# Patient Record
Sex: Female | Born: 1975 | Race: Black or African American | Hispanic: Yes | Smoking: Former smoker
Health system: Southern US, Community
[De-identification: ages and names within clinical notes are randomized; demographics above are authoritative.]

## PROBLEM LIST (undated history)

## (undated) DIAGNOSIS — T7840XA Allergy, unspecified, initial encounter: Secondary | ICD-10-CM

## (undated) DIAGNOSIS — N12 Tubulo-interstitial nephritis, not specified as acute or chronic: Secondary | ICD-10-CM

## (undated) DIAGNOSIS — M2550 Pain in unspecified joint: Secondary | ICD-10-CM

## (undated) DIAGNOSIS — F319 Bipolar disorder, unspecified: Secondary | ICD-10-CM

## (undated) DIAGNOSIS — I10 Essential (primary) hypertension: Secondary | ICD-10-CM

## (undated) DIAGNOSIS — K219 Gastro-esophageal reflux disease without esophagitis: Secondary | ICD-10-CM

## (undated) DIAGNOSIS — Z9289 Personal history of other medical treatment: Secondary | ICD-10-CM

## (undated) DIAGNOSIS — G43909 Migraine, unspecified, not intractable, without status migrainosus: Secondary | ICD-10-CM

## (undated) DIAGNOSIS — O1490 Unspecified pre-eclampsia, unspecified trimester: Secondary | ICD-10-CM

## (undated) DIAGNOSIS — Z973 Presence of spectacles and contact lenses: Secondary | ICD-10-CM

## (undated) DIAGNOSIS — Z8619 Personal history of other infectious and parasitic diseases: Secondary | ICD-10-CM

## (undated) DIAGNOSIS — E559 Vitamin D deficiency, unspecified: Secondary | ICD-10-CM

## (undated) DIAGNOSIS — F419 Anxiety disorder, unspecified: Secondary | ICD-10-CM

## (undated) DIAGNOSIS — E119 Type 2 diabetes mellitus without complications: Secondary | ICD-10-CM

## (undated) HISTORY — DX: Personal history of other infectious and parasitic diseases: Z86.19

## (undated) HISTORY — DX: Migraine, unspecified, not intractable, without status migrainosus: G43.909

## (undated) HISTORY — DX: Vitamin D deficiency, unspecified: E55.9

## (undated) HISTORY — DX: Personal history of other medical treatment: Z92.89

## (undated) HISTORY — DX: Gastro-esophageal reflux disease without esophagitis: K21.9

## (undated) HISTORY — DX: Allergy, unspecified, initial encounter: T78.40XA

## (undated) HISTORY — DX: Essential (primary) hypertension: I10

## (undated) HISTORY — DX: Bipolar disorder, unspecified: F31.9

## (undated) HISTORY — DX: Anxiety disorder, unspecified: F41.9

## (undated) HISTORY — DX: Presence of spectacles and contact lenses: Z97.3

## (undated) HISTORY — DX: Type 2 diabetes mellitus without complications: E11.9

## (undated) HISTORY — DX: Unspecified pre-eclampsia, unspecified trimester: O14.90

## (undated) HISTORY — PX: TUBAL LIGATION: SHX77

## (undated) HISTORY — DX: Pain in unspecified joint: M25.50

## (undated) HISTORY — DX: Tubulo-interstitial nephritis, not specified as acute or chronic: N12

---

## 2011-05-26 DIAGNOSIS — O1490 Unspecified pre-eclampsia, unspecified trimester: Secondary | ICD-10-CM

## 2011-05-26 DIAGNOSIS — N12 Tubulo-interstitial nephritis, not specified as acute or chronic: Secondary | ICD-10-CM

## 2011-05-26 HISTORY — DX: Unspecified pre-eclampsia, unspecified trimester: O14.90

## 2011-05-26 HISTORY — DX: Tubulo-interstitial nephritis, not specified as acute or chronic: N12

## 2012-05-25 HISTORY — PX: ORIF WRIST FRACTURE: SHX2133

## 2012-05-25 HISTORY — PX: CARPAL TUNNEL RELEASE: SHX101

## 2015-05-26 DIAGNOSIS — E119 Type 2 diabetes mellitus without complications: Secondary | ICD-10-CM

## 2015-05-26 HISTORY — DX: Type 2 diabetes mellitus without complications: E11.9

## 2016-05-25 HISTORY — PX: WRIST SURGERY: SHX841

## 2016-05-25 HISTORY — PX: DE QUERVAIN'S RELEASE: SHX1439

## 2016-05-25 HISTORY — PX: GASTRIC BYPASS: SHX52

## 2017-05-25 DIAGNOSIS — Z9289 Personal history of other medical treatment: Secondary | ICD-10-CM

## 2017-05-25 HISTORY — DX: Personal history of other medical treatment: Z92.89

## 2017-09-22 DIAGNOSIS — M779 Enthesopathy, unspecified: Secondary | ICD-10-CM | POA: Diagnosis not present

## 2017-09-22 DIAGNOSIS — M25531 Pain in right wrist: Secondary | ICD-10-CM | POA: Diagnosis not present

## 2017-10-13 DIAGNOSIS — M778 Other enthesopathies, not elsewhere classified: Secondary | ICD-10-CM | POA: Diagnosis not present

## 2017-10-13 DIAGNOSIS — M25531 Pain in right wrist: Secondary | ICD-10-CM | POA: Diagnosis not present

## 2018-06-15 ENCOUNTER — Telehealth: Payer: Self-pay | Admitting: Medical

## 2018-06-15 NOTE — Telephone Encounter (Signed)
Received records for new pt. First appt is 06/23/2018. Sending back for review.

## 2018-06-23 ENCOUNTER — Encounter: Payer: Self-pay | Admitting: Medical

## 2018-06-23 ENCOUNTER — Other Ambulatory Visit (HOSPITAL_COMMUNITY)
Admission: RE | Admit: 2018-06-23 | Discharge: 2018-06-23 | Disposition: A | Discharge status: Home / Self Care | Payer: BLUE CROSS/BLUE SHIELD | Source: Ambulatory Visit | Attending: Medical | Admitting: Medical

## 2018-06-23 ENCOUNTER — Ambulatory Visit (INDEPENDENT_AMBULATORY_CARE_PROVIDER_SITE_OTHER): Payer: BLUE CROSS/BLUE SHIELD | Admitting: Medical

## 2018-06-23 VITALS — BP 122/76 | HR 71 | Temp 98.6°F | Resp 16 | Ht 61.0 in | Wt 193.6 lb

## 2018-06-23 DIAGNOSIS — R4589 Other symptoms and signs involving emotional state: Secondary | ICD-10-CM

## 2018-06-23 DIAGNOSIS — E1169 Type 2 diabetes mellitus with other specified complication: Secondary | ICD-10-CM

## 2018-06-23 DIAGNOSIS — Z124 Encounter for screening for malignant neoplasm of cervix: Secondary | ICD-10-CM | POA: Diagnosis not present

## 2018-06-23 DIAGNOSIS — Z7189 Other specified counseling: Secondary | ICD-10-CM

## 2018-06-23 DIAGNOSIS — M25531 Pain in right wrist: Secondary | ICD-10-CM

## 2018-06-23 DIAGNOSIS — E119 Type 2 diabetes mellitus without complications: Secondary | ICD-10-CM | POA: Insufficient documentation

## 2018-06-23 DIAGNOSIS — F319 Bipolar disorder, unspecified: Secondary | ICD-10-CM

## 2018-06-23 DIAGNOSIS — G8929 Other chronic pain: Secondary | ICD-10-CM

## 2018-06-23 DIAGNOSIS — J309 Allergic rhinitis, unspecified: Secondary | ICD-10-CM | POA: Insufficient documentation

## 2018-06-23 DIAGNOSIS — Z0000 Encounter for general adult medical examination without abnormal findings: Secondary | ICD-10-CM | POA: Diagnosis not present

## 2018-06-23 DIAGNOSIS — I10 Essential (primary) hypertension: Secondary | ICD-10-CM

## 2018-06-23 DIAGNOSIS — F329 Major depressive disorder, single episode, unspecified: Secondary | ICD-10-CM

## 2018-06-23 DIAGNOSIS — K219 Gastro-esophageal reflux disease without esophagitis: Secondary | ICD-10-CM | POA: Insufficient documentation

## 2018-06-23 DIAGNOSIS — Z1231 Encounter for screening mammogram for malignant neoplasm of breast: Secondary | ICD-10-CM | POA: Insufficient documentation

## 2018-06-23 DIAGNOSIS — Z113 Encounter for screening for infections with a predominantly sexual mode of transmission: Secondary | ICD-10-CM

## 2018-06-23 DIAGNOSIS — F419 Anxiety disorder, unspecified: Secondary | ICD-10-CM

## 2018-06-23 DIAGNOSIS — Z7185 Encounter for immunization safety counseling: Secondary | ICD-10-CM | POA: Insufficient documentation

## 2018-06-23 DIAGNOSIS — E559 Vitamin D deficiency, unspecified: Secondary | ICD-10-CM

## 2018-06-23 DIAGNOSIS — G43909 Migraine, unspecified, not intractable, without status migrainosus: Secondary | ICD-10-CM | POA: Insufficient documentation

## 2018-06-23 HISTORY — DX: Pain in right wrist: M25.531

## 2018-06-23 HISTORY — DX: Allergic rhinitis, unspecified: J30.9

## 2018-06-23 HISTORY — DX: Encounter for screening for infections with a predominantly sexual mode of transmission: Z11.3

## 2018-06-23 HISTORY — DX: Gastro-esophageal reflux disease without esophagitis: K21.9

## 2018-06-23 HISTORY — DX: Other chronic pain: G89.29

## 2018-06-23 HISTORY — DX: Encounter for screening for malignant neoplasm of cervix: Z12.4

## 2018-06-23 HISTORY — DX: Migraine, unspecified, not intractable, without status migrainosus: G43.909

## 2018-06-23 HISTORY — DX: Other symptoms and signs involving emotional state: R45.89

## 2018-06-23 HISTORY — DX: Essential (primary) hypertension: I10

## 2018-06-23 LAB — POCT URINALYSIS DIP (PROADVANTAGE DEVICE)
Bilirubin, UA: NEGATIVE
Blood, UA: NEGATIVE
Glucose, UA: NEGATIVE mg/dL
Ketones, POC UA: NEGATIVE mg/dL
Leukocytes, UA: NEGATIVE
Nitrite, UA: NEGATIVE
Specific Gravity, Urine: 1.03
Urobilinogen, Ur: NEGATIVE
pH, UA: 6 (ref 5.0–8.0)

## 2018-06-23 MED ORDER — VITAMIN D 25 MCG (1000 UNIT) PO TABS: 1000.0000 [IU] | ORAL_TABLET | Freq: Every day | ORAL | 3 refills | Status: DC

## 2018-06-23 NOTE — Patient Instructions (Signed)
RESOURCES in [place], [place]  If you are experiencing a mental health crisis or an emergency, please call 911 or go to the nearest emergency department.  [facility]   [phone] [facility]  [phone] [facility]   [phone]  Suicide Hotline 1-800-Suicide 628 716 6963)  National Suicide Prevention Lifeline 5633020016  (757)320-2704)  Domestic Violence, Rape/Crisis - [facility] [place] 561-537-9432  [employer] Violence Hotline 1-800-799-SAFE (602)438-9412)  To report Child or Elder Abuse, please call: [facility] Police Department  [phone] [facility] Department  [phone]  [facility] Crisis Line (954)166-8050  Teen Crisis line 416-032-5361 or 941-079-3220     Psychiatry and Counseling services  [facility] [address], [place], [place] [address] [phone]  Stevphen Meuse, therapist Dr. Meredith Staggers, psychiatrist Dr. Beverly Milch, child psychiatrist   Dr. Len Blalock [address], [place], [place] [address] [phone]   Dr. Milagros Evener, psychiatry [address], [place], [place] [address] [phone]   [facility] [address] Sherian Maroon [place], [place] [address] [phone]   [facility] [address] [place], [place], [place] [address] [phone]    Counseling Services (NON- psychiatrist offices)  [facility] [address], [place], [place] [address] [phone]   [facility] [phone] [address] [place], [place] [address]   Glade Lloyd, therapist [phone] [address] [place], [place] [address]   [facility] [phone] [address] [place], [place], [place] [address]      Thanks for trusting [place] with your health care and for coming in for a physical today.  Below are some general recommendations I have for you:  Yearly screenings See your  eye doctor yearly for routine vision care. See your dentist yearly for routine dental care including hygiene visits twice yearly. See me here yearly for a routine physical and preventative care visit   Specific Concerns today:  Dr. Yancey Flemings, dentist [address], [place], [place] [address] [phone] [email or web address]   Please follow up yearly for a physical.    I have included other useful information below for your review.  Preventative Care for Adults - Female      MAINTAIN REGULAR HEALTH EXAMS:  A routine yearly physical is a good way to check in with your primary care provider about your health and preventive screening. It is also an opportunity to share updates about your health and any concerns you have, and receive a thorough all-over exam.   Most health insurance companies pay for at least some preventative services.  Check with your health plan for specific coverages.  WHAT PREVENTATIVE SERVICES DO WOMEN NEED?  Adult women should have their weight and blood pressure checked regularly.   Women age 41 and older should have their cholesterol levels checked regularly.  Women should be screened for cervical cancer with a Pap smear and pelvic exam beginning at either age 41, or 3 years after they become sexually activity.    Breast cancer screening generally begins at age 41 with a mammogram and breast exam by your primary care provider.    Beginning at age 41 and continuing to age 41, women should be screened for colorectal cancer.  Certain people may need continued testing until age 74.  Updating vaccinations is part of preventative care.  Vaccinations help protect against diseases such as the flu.  Osteoporosis is a disease in which the bones lose minerals and strength as  we age. Women ages 41 and over should discuss this with their caregivers, as should women after menopause who have other risk factors.  Lab tests are generally done as part of preventative care  to screen for anemia and blood disorders, to screen for problems with the kidneys and liver, to screen for bladder problems, to check blood sugar, and to check your cholesterol level.  Preventative services generally include counseling about diet, exercise, avoiding tobacco, drugs, excessive alcohol consumption, and sexually transmitted infections.    GENERAL RECOMMENDATIONS FOR GOOD HEALTH:  Healthy diet:  Eat a variety of foods, including fruit, vegetables, animal or vegetable protein, such as meat, fish, chicken, and eggs, or beans, lentils, tofu, and grains, such as rice.  Drink plenty of water daily.  Decrease saturated fat in the diet, avoid lots of red meat, processed foods, sweets, fast foods, and fried foods.  Exercise:  Aerobic exercise helps maintain good heart health. At least 30-40 minutes of moderate-intensity exercise is recommended. For example, a brisk walk that increases your heart rate and breathing. This should be done on most days of the week.   Find a type of exercise or a variety of exercises that you enjoy so that it becomes a part of your daily life.  Examples are running, walking, swimming, water aerobics, and biking.  For motivation and support, explore group exercise such as aerobic class, spin class, Zumba, Yoga,or  martial arts, etc.    Set exercise goals for yourself, such as a certain weight goal, walk or run in a race such as a 5k walk/run.  Speak to your primary care provider about exercise goals.  Disease prevention:  If you smoke or chew tobacco, find out from your caregiver how to quit. It can literally save your life, no matter how long you have been a tobacco user. If you do not use tobacco, never begin.   Maintain a healthy diet and normal weight. Increased weight leads to problems with blood pressure and diabetes.   The Body Mass Index or BMI is a way of measuring how much of your body is fat. Having a BMI above 27 increases the risk of heart  disease, diabetes, hypertension, stroke and other problems related to obesity. Your caregiver can help determine your BMI and based on it develop an exercise and dietary program to help you achieve or maintain this important measurement at a healthful level.  High blood pressure causes heart and blood vessel problems.  Persistent high blood pressure should be treated with medicine if weight loss and exercise do not work.   Fat and cholesterol leaves deposits in your arteries that can block them. This causes heart disease and vessel disease elsewhere in your body.  If your cholesterol is found to be high, or if you have heart disease or certain other medical conditions, then you may need to have your cholesterol monitored frequently and be treated with medication.   Ask if you should have a cardiac stress test if your history suggests this. A stress test is a test done on a treadmill that looks for heart disease. This test can find disease prior to there being a problem.  Menopause can be associated with physical symptoms and risks. Hormone replacement therapy is available to decrease these. You should talk to your caregiver about whether starting or continuing to take hormones is right for you.   Osteoporosis is a disease in which the bones lose minerals and strength as we age. This can result  in serious bone fractures. Risk of osteoporosis can be identified using a bone density scan. Women ages 7265 and over should discuss this with their caregivers, as should women after menopause who have other risk factors. Ask your caregiver whether you should be taking a calcium supplement and Vitamin D, to reduce the rate of osteoporosis.   Avoid drinking alcohol in excess (more than two drinks per day).  Avoid use of street drugs. Do not share needles with anyone. Ask for professional help if you need assistance or instructions on stopping the use of alcohol, cigarettes, and/or drugs.  Brush your teeth twice a  day with fluoride toothpaste, and floss once a day. Good oral hygiene prevents tooth decay and gum disease. The problems can be painful, unattractive, and can cause other health problems. Visit your dentist for a routine oral and dental check up and preventive care every 6-12 months.   Look at your skin regularly.  Use a mirror to look at your back. Notify your caregivers of changes in moles, especially if there are changes in shapes, colors, a size larger than a pencil eraser, an irregular border, or development of new moles.  Safety:  Use seatbelts 100% of the time, whether driving or as a passenger.  Use safety devices such as hearing protection if you work in environments with loud noise or significant background noise.  Use safety glasses when doing any work that could send debris in to the eyes.  Use a helmet if you ride a bike or motorcycle.  Use appropriate safety gear for contact sports.  Talk to your caregiver about gun safety.  Use sunscreen with a SPF (or skin protection factor) of 15 or greater.  Lighter skinned people are at a greater risk of skin cancer. Don't forget to also wear sunglasses in order to protect your eyes from too much damaging sunlight. Damaging sunlight can accelerate cataract formation.   Practice safe sex. Use condoms. Condoms are used for birth control and to help reduce the spread of sexually transmitted infections (or STIs).  Some of the STIs are gonorrhea (the clap), chlamydia, syphilis, trichomonas, herpes, HPV (human papilloma virus) and HIV (human immunodeficiency virus) which causes AIDS. The herpes, HIV and HPV are viral illnesses that have no cure. These can result in disability, cancer and death.   Keep carbon monoxide and smoke detectors in your home functioning at all times. Change the batteries every 6 months or use a model that plugs into the wall.   Vaccinations:  Stay up to date with your tetanus shots and other required immunizations. You should  have a booster for tetanus every 10 years. Be sure to get your flu shot every year, since 5%-20% of the [place] population comes down with the flu. The flu vaccine changes each year, so being vaccinated once is not enough. Get your shot in the fall, before the flu season peaks.   Other vaccines to consider:  Human Papilloma Virus or HPV causes cancer of the cervix, and other infections that can be transmitted from person to person. There is a vaccine for HPV, and females should get immunized between the ages of 2711 and 3726. It requires a series of 3 shots.   Pneumococcal vaccine to protect against certain types of pneumonia.  This is normally recommended for adults age 41 or older.  However, adults younger than 41 years old with certain underlying conditions such as diabetes, heart or lung disease should also receive the vaccine.  Shingles vaccine  to protect against Varicella Zoster if you are older than age 41, or younger than 41 years old with certain underlying illness.  If you have not had the Shingrix vaccine, please call your insurer to inquire about coverage for the Shingrix vaccine given in 2 doses.   Some insurers cover this vaccine after age 41, some cover this after age 41.  If your insurer covers this, then call to schedule appointment to have this vaccine here  Hepatitis A vaccine to protect against a form of infection of the liver by a virus acquired from food.  Hepatitis B vaccine to protect against a form of infection of the liver by a virus acquired from blood or body fluids, particularly if you work in health care.  If you plan to travel internationally, check with your local health department for specific vaccination recommendations.  Cancer Screening:  Breast cancer screening is essential to preventive care for women. All women age 41 and older should perform a breast self-exam every month. At age 41 and older, women should have their caregiver complete a breast exam each year. Women  at ages 5140 and older should have a mammogram (x-ray film) of the breasts. Your caregiver can discuss how often you need mammograms.    Cervical cancer screening includes taking a Pap smear (sample of cells examined under a microscope) from the cervix (end of the uterus). It also includes testing for HPV (Human Papilloma Virus, which can cause cervical cancer). Screening and a pelvic exam should begin at age 41, or 3 years after a woman becomes sexually active. Screening should occur every year, with a Pap smear but no HPV testing, up to age 41. After age 41, you should have a Pap smear every 3 years with HPV testing, if no HPV was found previously.   Most routine colon cancer screening begins at the age of 41. On a yearly basis, doctors may provide special easy to use take-home tests to check for hidden blood in the stool. Sigmoidoscopy or colonoscopy can detect the earliest forms of colon cancer and is life saving. These tests use a small camera at the end of a tube to directly examine the colon. Speak to your caregiver about this at age 150, when routine screening begins (and is repeated every 5 years unless early forms of pre-cancerous polyps or small growths are found).

## 2018-06-23 NOTE — Progress Notes (Signed)
Subjective:   HPI  Rebecca Mueller is a 43 y.o. female who presents for Chief Complaint  Patient presents with  . NP    NP CPE & Pap fasting labs  vision exam 05-2018    Medical care team includes: Tysinger, Cleda MccreedyDavid S, PA-C here for primary care Eye doctor  Concerns: Prior vaccines 2013 Td  Gynecological history: 6 pregnancies, 4 live births, 1 set of twins. Periods are regular, not heavy.  Periods were quite heavy on Xarelto this past year.  Divorced.  Divorced 2018.  Had one set of STD testing since the divorce, and has had new sexual partners this past year.    HTN - stopped her medication on her own after feeling dizzy on medication.   Last took these since 03/2018  Had diagnosis of Afib, early 2019, was on Xarelto for most 2019, but can't afford this.   [place] early in [place]   Was advised other testing, but never did this.  Reportedly she converted spontaneously back to normal rhythm.  Hx/o bipolar disorder, last on medication 03/2018, was on Latuda.  Stopped due to copay over $1000.  At one point was on Welbutrin.  Can't take welbutin alone due to this causing mania.   Didn't feel all that improved on Latuda + Wellbutrin.  In past hasn't responded well to medication.  Major triggers of mania in the past has been crisis moments or very stressful moments   This has only happened a few times.  Just moved here 3 months ago.   Past Medical History:  Diagnosis Date  . Allergy   . Anxiety   . Bipolar disorder [facility](HCC)    hospitalized 2010, 2013  . Diabetes mellitus without complication (HCC) 2017   type 2  . GERD (gastroesophageal reflux disease)   . History of cardiovascular stress test 2019   after chest pain, reportedly normal per patient 05/2018  . History of cold sores   . Hypertension   . Joint pain    right wrist, chronic pain  . Migraines    since childhood  . Preeclampsia 2013   with acute kidney failure, acute tubular necrosis, dialysis  1.5 mo, was in ICU  . Pyelonephritis 2013  . Vitamin D deficiency   . Wears glasses     Past Surgical History:  Procedure Laterality Date  . CARPAL TUNNEL RELEASE  2014  . CESAREAN SECTION     x4  . DE QUERVAIN'S RELEASE  2018  . 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 ORIF WRIST FRACTURE  2014   right  . TUBAL LIGATION    . WRIST SURGERY  2018   hardware removal     Social History   Socioeconomic History  . Marital status: Divorced    Spouse name: Not on file  . Number of children: Not on file  . Years of education: Not on file  . Highest education level: Not on file  Occupational History  . Not on file  Social Needs  . Financial resource strain: Not on file  . Food insecurity:    Worry: Not on file    Inability: Not on file  . Transportation needs:    Medical: Not on file    Non-medical: Not on file  Tobacco Use  . Smoking status: Former Smoker    Packs/day: 0.50    Years: 10.00    Pack years: 5.00    Last attempt to quit: 06/23/2016  Years since quitting: 2.0  . Smokeless tobacco: Never Used  Substance and Sexual Activity  . Alcohol use: Yes    Comment: occasional wine  . Drug use: Not Currently    Comment: prior marijuana  . Sexual activity: Not on file  Lifestyle  . Physical activity:    Days per week: Not on file    Minutes per session: Not on file  . Stress: Not on file  Relationships  . Social connections:    Talks on phone: Not on file    Gets together: Not on file    Attends religious service: Not on file    Active member of club or organization: Not on file    Attends meetings of clubs or organizations: Not on file    Relationship status: Not on file  . Intimate partner violence:    Fear of current or ex partner: Not on file    Emotionally abused: Not on file    Physically abused: Not on file    Forced sexual activity: Not on file  Other Topics Concern  . Not on file  Social History Narrative   Lives with her 4 children, [place],  moved from [place], [place] to [place] late 2019.  Works for [employer], Financial risk analyst.   05/2018    Family History  Problem Relation Age of Onset  . Stroke Mother   . Diabetes Mother   . Multiple sclerosis Mother   . Hypertension Mother   . Diabetes Father   . Hypertension Father   . Cancer Father        liver  . Diabetes Sister   . Hypertension Sister   . Bipolar disorder Sister   . Heart disease Maternal Grandmother   . Diabetes Maternal Grandmother   . Kidney disease Maternal Grandmother   . Hypertension Maternal Grandmother   . Heart disease Maternal Grandfather   . Hypertension Maternal Grandfather   . Cancer Maternal Grandfather        prostate  . Diabetes Sister   . Hypertension Sister   . Bipolar disorder Sister      Current Outpatient Medications:  .  Cholecalciferol (VITAMIN D3) 1.25 MG (50000 UT) CAPS, Take 1 capsule by mouth once a week., Disp: , Rfl:  .  omeprazole (PRILOSEC) 40 MG capsule, Take 40 mg by mouth daily., Disp: , Rfl:  .  cholecalciferol (VITAMIN D3) 25 MCG (1000 UT) tablet, Take 1 tablet (1,000 Units total) by mouth daily., Disp: 90 tablet, Rfl: 3 .  valACYclovir (VALTREX) 1000 MG tablet, Take 1,000 mg by mouth as needed., Disp: , Rfl:   Allergies  Allergen Reactions  . Nsaids     Avoids due to hx/o sleeve gastric bypass  . Penicillins Rash  . Promethazine Other (See Comments)    Shaky legs, restless legs     Reviewed their medical, surgical, family, social, medication, and allergy history and updated chart as appropriate.   Review of Systems Constitutional: -fever, -chills, -sweats, -unexpected weight change, -decreased appetite, -fatigue Allergy: -sneezing, -itching, -congestion Dermatology: -changing moles, --rash, -lumps ENT: -runny nose, -ear pain, -sore throat, -hoarseness, -sinus pain, -teeth pain, - ringing in ears, -hearing loss, -nosebleeds Cardiology: -chest pain, -palpitations, -swelling, -difficulty  breathing when lying flat, -waking up short of breath Respiratory: -cough, -shortness of breath, -difficulty breathing with exercise or exertion, -wheezing, -coughing up blood Gastroenterology: -abdominal pain, -nausea, -vomiting, -diarrhea, -constipation, -blood in stool, -changes in bowel movement, -difficulty swallowing or eating Hematology: -bleeding, -  bruising  Musculoskeletal: -joint aches, -muscle aches, -joint swelling, -back pain, -neck pain, -cramping, -changes in gait Ophthalmology: denies vision changes, eye redness, itching, discharge Urology: -burning with urination, -difficulty urinating, -blood in urine, -urinary frequency, -urgency, -incontinence Neurology: -headache, -weakness, -tingling, -numbness, -memory loss, -falls, -dizziness Psychology: +depressed mood, -agitation, -sleep problems Breast/gyn: -breast tenderness, -discharge, -lumps, -vaginal discharge,- irregular periods, -heavy periods     Objective:  BP 122/76   Pulse 71   Temp 98.6 F (37 C) (Oral)   Resp 16   Ht 5\' 1"  (1.549 m)   Wt 193 lb 9.6 oz (87.8 kg)   LMP 05/30/2018 (Approximate)   SpO2 99%   BMI 36.58 kg/m   General appearance: alert, no distress, WD/WN, African American female Skin: scattered macules, unremarkable HEENT: normocephalic, conjunctiva/corneas normal, sclerae anicteric, PERRLA, EOMi, nares patent, no discharge or erythema, pharynx normal Oral cavity: MMM, tongue normal, teeth with moderate plaque Neck: supple, no lymphadenopathy, no thyromegaly, no masses, normal ROM, no bruits Chest: non tender, normal shape and expansion Heart: RRR, normal S1, S2, no murmurs Lungs: CTA bilaterally, no wheezes, rhonchi, or rales Abdomen: +bs, soft, non tender, non distended, no masses, no hepatomegaly, no splenomegaly, no bruits Back: non tender, normal ROM, no scoliosis Musculoskeletal: right volar wrist linear surgical scar, upper extremities non tender, no obvious deformity, normal ROM  throughout, lower extremities non tender, no obvious deformity, normal ROM throughout Extremities: no edema, no cyanosis, no clubbing Pulses: 2+ symmetric, upper and lower extremities, normal cap refill Neurological: alert, oriented x 3, CN2-12 intact, strength normal upper extremities and lower extremities, sensation normal throughout, DTRs 2+ throughout, no cerebellar signs, gait normal Psychiatric: normal affect, behavior normal, pleasant  Breast: nontender, no masses or lumps, no skin changes, no nipple discharge or inversion, no axillary lymphadenopathy Gyn: Normal external genitalia without lesions, vagina with normal mucosa, cervix without lesions, no cervical motion tenderness, no abnormal vaginal discharge.  Uterus questionably enlarged, retroverted, otherwise adnexa not enlarged, nontender, no masses.  Pap performed.  Exam chaperoned by nurse. Rectal: deferred    Assessment and Plan :   Encounter Diagnoses  Name Primary?  . Routine general medical examination at a health care facility Yes  . Vaccine counseling   . Type 2 diabetes mellitus with other specified complication, without long-term current use of insulin (HCC)   . Vitamin D deficiency   . Chronic wrist pain, right   . Migraine without status migrainosus, not intractable, unspecified migraine type   . Gastroesophageal reflux disease without esophagitis   . Bipolar affective disorder, remission status unspecified (HCC)   . Depressed mood   . Anxiety   . Allergic rhinitis, unspecified seasonality, unspecified trigger   . Essential hypertension   . Screen for STD (sexually transmitted disease)   . Screening for cervical cancer     Physical exam - discussed and counseled on healthy lifestyle, diet, exercise, preventative care, vaccinations, sick and well care, proper use of emergency dept and after hours care, and addressed their concerns.    Health screening: Advised they see their eye doctor yearly for routine vision  care. Advised they see their dentist yearly for routine dental care including hygiene visits twice yearly.  Discussed STD testing, discussed prevention, condom use, means of transmission    S/p tubal ligation  Cancer screening Counseled on self breast exams, mammograms, cervical cancer screening  Vaccinations: Advised yearly influenza vaccine She declines Up to date on Td 2013   Separate significant issues discussed: Vaccine counseling: Recommend  yearly flu shot, consider vaccines for diabetics including pneumococcal vaccine, updated tetanus vaccine if not up-to-date  Diabetes type 2-she will start checking her blood sugars soon, she does currently glucometer, she is not currently on medication, no concern for foot lesions,  Vitamin D deficiency-labs today  Chronic wrist pain-history of fracture, ORIF surgery, subsequent carpal tunnel release, multiple surgeries on right wrist  Migraines- can discuss further with these are fairly frequently.  Consider separate follow-up for this  GERD- avoid GERD triggers  Bipolar disorder, depression, anxiety--establish with psychiatry and counseling.  Gave list of providers in the area  Hypertension- currently controlled with lifestyle    Dorothye was seen today for np.  Diagnoses and all orders for this visit:  Routine general medical examination at a health care facility -     POCT Urinalysis DIP (Proadvantage Device) -     Comprehensive metabolic panel -     CBC -     Lipid panel -     TSH -     Hemoglobin A1c -     HIV Antibody (routine testing w rflx) -     RPR -     Cytology - PAP([place]) -     Microalbumin / creatinine urine ratio -     VITAMIN D 25 Hydroxy (Vit-D Deficiency, Fractures)  Vaccine counseling  Type 2 diabetes mellitus with other specified complication, without long-term current use of insulin (HCC) -     Hemoglobin A1c -     Microalbumin / creatinine urine ratio  Vitamin D deficiency -     VITAMIN  D 25 Hydroxy (Vit-D Deficiency, Fractures)  Chronic wrist pain, right  Migraine without status migrainosus, not intractable, unspecified migraine type  Gastroesophageal reflux disease without esophagitis  Bipolar affective disorder, remission status unspecified (HCC)  Depressed mood  Anxiety  Allergic rhinitis, unspecified seasonality, unspecified trigger  Essential hypertension  Screen for STD (sexually transmitted disease) -     HIV Antibody (routine testing w rflx) -     RPR -     Cytology - PAP([place])  Screening for cervical cancer -     HIV Antibody (routine testing w rflx) -     RPR -     Cytology - PAP([place])  Other orders -     cholecalciferol (VITAMIN D3) 25 MCG (1000 UT) tablet; Take 1 tablet (1,000 Units total) by mouth daily.    Follow-up pending labs, yearly for physical

## 2018-06-24 LAB — COMPREHENSIVE METABOLIC PANEL
ALT: 7 IU/L (ref 0–32)
AST: 10 IU/L (ref 0–40)
Albumin/Globulin Ratio: 1.7 (ref 1.2–2.2)
Albumin: 4.5 g/dL (ref 3.8–4.8)
Alkaline Phosphatase: 70 IU/L (ref 39–117)
BUN/Creatinine Ratio: 9 (ref 9–23)
BUN: 10 mg/dL (ref 6–24)
Bilirubin Total: 0.4 mg/dL (ref 0.0–1.2)
CO2: 19 mmol/L — ABNORMAL LOW (ref 20–29)
Calcium: 9.3 mg/dL (ref 8.7–10.2)
Chloride: 104 mmol/L (ref 96–106)
Creatinine, Ser: 1.13 mg/dL — ABNORMAL HIGH (ref 0.57–1.00)
GFR calc Af Amer: 70 mL/min/{1.73_m2} (ref >59)
GFR calc non Af Amer: 61 mL/min/{1.73_m2} (ref >59)
Globulin, Total: 2.6 g/dL (ref 1.5–4.5)
Glucose: 87 mg/dL (ref 65–99)
Potassium: 4.7 mmol/L (ref 3.5–5.2)
Sodium: 141 mmol/L (ref 134–144)
Total Protein: 7.1 g/dL (ref 6.0–8.5)

## 2018-06-24 LAB — LIPID PANEL
Chol/HDL Ratio: 2.3 ratio (ref 0.0–4.4)
Cholesterol, Total: 223 mg/dL — ABNORMAL HIGH (ref 100–199)
HDL: 95 mg/dL (ref >39)
LDL Calculated: 110 mg/dL — ABNORMAL HIGH (ref 0–99)
Triglycerides: 89 mg/dL (ref 0–149)
VLDL Cholesterol Cal: 18 mg/dL (ref 5–40)

## 2018-06-24 LAB — CBC
Hematocrit: 36.3 % (ref 34.0–46.6)
Hemoglobin: 12.4 g/dL (ref 11.1–15.9)
MCH: 28.4 pg (ref 26.6–33.0)
MCHC: 34.2 g/dL (ref 31.5–35.7)
MCV: 83 fL (ref 79–97)
Platelets: 268 10*3/uL (ref 150–450)
RBC: 4.37 x10E6/uL (ref 3.77–5.28)
RDW: 12.2 % (ref 11.7–15.4)
WBC: 4.9 10*3/uL (ref 3.4–10.8)

## 2018-06-24 LAB — TSH: TSH: 1.41 u[IU]/mL (ref 0.450–4.500)

## 2018-06-24 LAB — HIV ANTIBODY (ROUTINE TESTING W REFLEX): HIV Screen 4th Generation wRfx: NONREACTIVE

## 2018-06-24 LAB — MICROALBUMIN / CREATININE URINE RATIO
Creatinine, Urine: 347.4 mg/dL
Microalb/Creat Ratio: 5 mg/g creat (ref 0–29)
Microalbumin, Urine: 17.7 ug/mL

## 2018-06-24 LAB — RPR: RPR Ser Ql: NONREACTIVE

## 2018-06-24 LAB — HEMOGLOBIN A1C
Est. average glucose Bld gHb Est-mCnc: 120 mg/dL
Hgb A1c MFr Bld: 5.8 % — ABNORMAL HIGH (ref 4.8–5.6)

## 2018-06-24 LAB — VITAMIN D 25 HYDROXY (VIT D DEFICIENCY, FRACTURES): Vit D, 25-Hydroxy: 48.2 ng/mL (ref 30.0–100.0)

## 2018-06-27 ENCOUNTER — Encounter: Payer: Self-pay | Admitting: Medical

## 2018-06-28 LAB — CYTOLOGY - PAP
Adequacy: ABSENT
Chlamydia: NEGATIVE
Diagnosis: NEGATIVE
Neisseria Gonorrhea: NEGATIVE

## 2018-07-04 ENCOUNTER — Telehealth: Payer: Self-pay | Admitting: Medical

## 2018-07-04 ENCOUNTER — Encounter: Payer: Self-pay | Admitting: Medical

## 2018-07-04 NOTE — Telephone Encounter (Signed)
Received requested records from [facility]. Sending back for review.

## 2018-07-15 ENCOUNTER — Encounter: Payer: Self-pay | Admitting: Medical

## 2018-10-21 ENCOUNTER — Other Ambulatory Visit: Payer: Self-pay | Admitting: Medical

## 2018-10-21 DIAGNOSIS — E559 Vitamin D deficiency, unspecified: Secondary | ICD-10-CM

## 2019-03-13 ENCOUNTER — Other Ambulatory Visit: Payer: Self-pay

## 2019-03-13 DIAGNOSIS — Z20822 Contact with and (suspected) exposure to covid-19: Secondary | ICD-10-CM

## 2019-03-13 DIAGNOSIS — Z20828 Contact with and (suspected) exposure to other viral communicable diseases: Secondary | ICD-10-CM | POA: Diagnosis not present

## 2019-03-15 LAB — NOVEL CORONAVIRUS, NAA: SARS-CoV-2, NAA: NOT DETECTED

## 2019-04-16 DIAGNOSIS — M25531 Pain in right wrist: Secondary | ICD-10-CM | POA: Diagnosis not present

## 2019-04-16 DIAGNOSIS — N898 Other specified noninflammatory disorders of vagina: Secondary | ICD-10-CM | POA: Diagnosis not present

## 2019-04-21 ENCOUNTER — Other Ambulatory Visit: Payer: Self-pay | Admitting: Medical

## 2019-04-21 DIAGNOSIS — E559 Vitamin D deficiency, unspecified: Secondary | ICD-10-CM

## 2019-04-24 NOTE — Telephone Encounter (Signed)
Does pt need vitamin D 50,000 rx or otc vitamin D

## 2019-04-24 NOTE — Telephone Encounter (Signed)
Refill x 1 and get in for med check.

## 2019-07-25 ENCOUNTER — Other Ambulatory Visit: Payer: Self-pay

## 2019-07-25 ENCOUNTER — Telehealth: Payer: Self-pay | Admitting: Medical

## 2019-07-25 DIAGNOSIS — E559 Vitamin D deficiency, unspecified: Secondary | ICD-10-CM

## 2019-07-25 NOTE — Telephone Encounter (Signed)
The request has been sent to provider.

## 2019-07-25 NOTE — Telephone Encounter (Signed)
Pt called and made an CPE appt when she is off work in April. Please refill Vitamin DS 1.25 until that appt. Pt uses [facility].

## 2019-07-26 ENCOUNTER — Other Ambulatory Visit: Payer: Self-pay | Admitting: Medical

## 2019-07-26 DIAGNOSIS — E559 Vitamin D deficiency, unspecified: Secondary | ICD-10-CM

## 2019-07-26 NOTE — Progress Notes (Signed)
Refill vit D 30 days, get in for physical.     This message comes to me looking different than most refill requests or other messages.  Can you explain why this message looks different?

## 2019-07-26 NOTE — Telephone Encounter (Signed)
Is this appropriate to refill for this dosage and this amount?

## 2019-07-26 NOTE — Telephone Encounter (Signed)
Please review previous message. 

## 2019-07-27 NOTE — Progress Notes (Signed)
This has been taken care of.

## 2019-08-10 ENCOUNTER — Emergency Department: Payer: BC Managed Care – PPO

## 2019-08-10 ENCOUNTER — Other Ambulatory Visit: Payer: Self-pay

## 2019-08-10 DIAGNOSIS — Z87891 Personal history of nicotine dependence: Secondary | ICD-10-CM | POA: Diagnosis not present

## 2019-08-10 DIAGNOSIS — R0602 Shortness of breath: Secondary | ICD-10-CM | POA: Diagnosis not present

## 2019-08-10 DIAGNOSIS — Z79899 Other long term (current) drug therapy: Secondary | ICD-10-CM | POA: Diagnosis not present

## 2019-08-10 DIAGNOSIS — E119 Type 2 diabetes mellitus without complications: Secondary | ICD-10-CM | POA: Insufficient documentation

## 2019-08-10 DIAGNOSIS — I10 Essential (primary) hypertension: Secondary | ICD-10-CM | POA: Diagnosis not present

## 2019-08-10 DIAGNOSIS — I82401 Acute embolism and thrombosis of unspecified deep veins of right lower extremity: Secondary | ICD-10-CM | POA: Diagnosis not present

## 2019-08-10 DIAGNOSIS — I82451 Acute embolism and thrombosis of right peroneal vein: Secondary | ICD-10-CM | POA: Diagnosis not present

## 2019-08-10 DIAGNOSIS — M79661 Pain in right lower leg: Secondary | ICD-10-CM | POA: Diagnosis not present

## 2019-08-10 LAB — COMPREHENSIVE METABOLIC PANEL WITH GFR
ALT: 89 U/L — ABNORMAL HIGH (ref 0–44)
AST: 49 U/L — ABNORMAL HIGH (ref 15–41)
Albumin: 4.4 g/dL (ref 3.5–5.0)
Alkaline Phosphatase: 59 U/L (ref 38–126)
Anion gap: 10 (ref 5–15)
BUN: 13 mg/dL (ref 6–20)
CO2: 23 mmol/L (ref 22–32)
Calcium: 9.1 mg/dL (ref 8.9–10.3)
Chloride: 103 mmol/L (ref 98–111)
Creatinine, Ser: 1.01 mg/dL — ABNORMAL HIGH (ref 0.44–1.00)
GFR calc Af Amer: >60 mL/min
GFR calc non Af Amer: >60 mL/min
Glucose, Bld: 99 mg/dL (ref 70–99)
Potassium: 3.9 mmol/L (ref 3.5–5.1)
Sodium: 136 mmol/L (ref 135–145)
Total Bilirubin: 0.7 mg/dL (ref 0.3–1.2)
Total Protein: 7.7 g/dL (ref 6.5–8.1)

## 2019-08-10 LAB — CBC WITH DIFFERENTIAL/PLATELET
Abs Immature Granulocytes: 0.01 K/uL (ref 0.00–0.07)
Basophils Absolute: 0.1 K/uL (ref 0.0–0.1)
Basophils Relative: 1 %
Eosinophils Absolute: 0.1 K/uL (ref 0.0–0.5)
Eosinophils Relative: 1 %
HCT: 35.5 % — ABNORMAL LOW (ref 36.0–46.0)
Hemoglobin: 11.4 g/dL — ABNORMAL LOW (ref 12.0–15.0)
Immature Granulocytes: 0 %
Lymphocytes Relative: 51 %
Lymphs Abs: 3.3 K/uL (ref 0.7–4.0)
MCH: 26.1 pg (ref 26.0–34.0)
MCHC: 32.1 g/dL (ref 30.0–36.0)
MCV: 81.2 fL (ref 80.0–100.0)
Monocytes Absolute: 0.4 K/uL (ref 0.1–1.0)
Monocytes Relative: 6 %
Neutro Abs: 2.6 K/uL (ref 1.7–7.7)
Neutrophils Relative %: 41 %
Platelets: 312 K/uL (ref 150–400)
RBC: 4.37 MIL/uL (ref 3.87–5.11)
RDW: 14.8 % (ref 11.5–15.5)
WBC: 6.5 K/uL (ref 4.0–10.5)
nRBC: 0 % (ref 0.0–0.2)

## 2019-08-10 LAB — PROTIME-INR
INR: 1 (ref 0.8–1.2)
Prothrombin Time: 13.2 seconds (ref 11.4–15.2)

## 2019-08-10 NOTE — ED Notes (Signed)
Right pedal pulse palpable.

## 2019-08-10 NOTE — ED Triage Notes (Signed)
Pt to the er for right calf pain for 3 weeks. Pt states pain became worse today and pressure down to the foot. The pain is posterior. Pt has a hx of afib and was on xarelto but stopped 1 year ago due to cost of drug. Pt has a sedentary job.

## 2019-08-11 ENCOUNTER — Encounter: Payer: Self-pay | Admitting: Medical

## 2019-08-11 ENCOUNTER — Emergency Department: Payer: BC Managed Care – PPO

## 2019-08-11 ENCOUNTER — Ambulatory Visit (INDEPENDENT_AMBULATORY_CARE_PROVIDER_SITE_OTHER): Payer: BC Managed Care – PPO | Admitting: Medical

## 2019-08-11 ENCOUNTER — Emergency Department
Admission: EM | Admit: 2019-08-11 | Discharge: 2019-08-11 | Disposition: A | Discharge status: Home / Self Care | Payer: BC Managed Care – PPO | Attending: Emergency Medicine | Admitting: Emergency Medicine

## 2019-08-11 ENCOUNTER — Telehealth: Payer: Self-pay | Admitting: Medical

## 2019-08-11 VITALS — Ht 60.0 in | Wt 190.0 lb

## 2019-08-11 DIAGNOSIS — Z8679 Personal history of other diseases of the circulatory system: Secondary | ICD-10-CM

## 2019-08-11 DIAGNOSIS — I824Y1 Acute embolism and thrombosis of unspecified deep veins of right proximal lower extremity: Secondary | ICD-10-CM | POA: Diagnosis not present

## 2019-08-11 DIAGNOSIS — I82451 Acute embolism and thrombosis of right peroneal vein: Secondary | ICD-10-CM

## 2019-08-11 DIAGNOSIS — Z832 Family history of diseases of the blood and blood-forming organs and certain disorders involving the immune mechanism: Secondary | ICD-10-CM

## 2019-08-11 DIAGNOSIS — R52 Pain, unspecified: Secondary | ICD-10-CM

## 2019-08-11 DIAGNOSIS — M79604 Pain in right leg: Secondary | ICD-10-CM | POA: Insufficient documentation

## 2019-08-11 DIAGNOSIS — Z8759 Personal history of other complications of pregnancy, childbirth and the puerperium: Secondary | ICD-10-CM

## 2019-08-11 DIAGNOSIS — R918 Other nonspecific abnormal finding of lung field: Secondary | ICD-10-CM | POA: Diagnosis not present

## 2019-08-11 HISTORY — DX: Personal history of other diseases of the circulatory system: Z86.79

## 2019-08-11 HISTORY — DX: Acute embolism and thrombosis of right peroneal vein: I82.451

## 2019-08-11 HISTORY — DX: Family history of diseases of the blood and blood-forming organs and certain disorders involving the immune mechanism: Z83.2

## 2019-08-11 HISTORY — DX: Personal history of other complications of pregnancy, childbirth and the puerperium: Z87.59

## 2019-08-11 HISTORY — DX: Pain in right leg: M79.604

## 2019-08-11 LAB — POCT PREGNANCY, URINE: Preg Test, Ur: NEGATIVE

## 2019-08-11 LAB — BRAIN NATRIURETIC PEPTIDE: B Natriuretic Peptide: 18 pg/mL (ref 0.0–100.0)

## 2019-08-11 LAB — TROPONIN I (HIGH SENSITIVITY): Troponin I (High Sensitivity): <2 ng/L (ref <18)

## 2019-08-11 MED ORDER — IOHEXOL 350 MG/ML SOLN
75.0000 mL | Freq: Once | INTRAVENOUS | Status: AC | PRN
  Administered 2019-08-11: 75 mL via INTRAVENOUS
  Filled 2019-08-11: qty 75

## 2019-08-11 MED ORDER — HYDROCODONE-ACETAMINOPHEN 7.5-325 MG PO TABS: 1.0000 | ORAL_TABLET | Freq: Four times a day (QID) | ORAL | 0 refills | Status: AC | PRN

## 2019-08-11 MED ORDER — RIVAROXABAN 15 MG PO TABS
15.0000 mg | ORAL_TABLET | Freq: Once | ORAL | Status: AC
  Administered 2019-08-11: 15 mg via ORAL
  Filled 2019-08-11: qty 1

## 2019-08-11 MED ORDER — RIVAROXABAN (XARELTO) VTE STARTER PACK (15 & 20 MG): ORAL_TABLET | ORAL | 0 refills | Status: DC

## 2019-08-11 MED ORDER — DIPHENHYDRAMINE HCL 50 MG/ML IJ SOLN
50.0000 mg | Freq: Once | INTRAMUSCULAR | Status: AC
  Administered 2019-08-11: 50 mg via INTRAVENOUS
  Filled 2019-08-11: qty 1

## 2019-08-11 NOTE — ED Notes (Signed)
Pt transported to CT ?

## 2019-08-11 NOTE — Progress Notes (Signed)
This visit type was conducted due to national recommendations for restrictions regarding the COVID-19 Pandemic (e.g. social distancing) in an effort to limit this patient's exposure and mitigate transmission in our community.  Due to their co-morbid illnesses, this patient is at least at moderate risk for complications without adequate follow up.  This format is felt to be most appropriate for this patient at this time.    Documentation for virtual audio and video telecommunications through Zoom encounter:  The patient was located at home. The provider was located in the office. The patient did consent to this visit and is aware of possible charges through their insurance for this visit.  The other persons participating in this telemedicine service were none. Time spent on call was 20 minutes and in review of previous records >20 minutes total.  This virtual service is not related to other E/M service within previous 7 days.  Subjective: Chief Complaint  Patient presents with  . Follow-up    clot if the leg   Virtual consult today for DVT.  She was seen in emergency department yesterday for 2 to 3 weeks of right calf pain and was diagnosed with DVT.  She started Xarelto yesterday.  This was unprovoked.  No recent injury, not on birth control.  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  She does have family history of blood clot and PE on mother's side, she notes 2 miscarriages herself.  No personal history of DVT or PE.  She denies shortness of breath.  Her leg is still painful which is why she called in asking for something for pain more than just Tylenol.  She denies wheezing or shortness of breath no chest pain.  She does have a history of atrial flutter in the past  She denies recent surgery, trauma, injury, fall, no recent long travel, recent history of procedure otherwise.  She has a history of tubal ligation.  She vapes.  No other aggravating or relieving factors. No other  complaint.     Past Medical History:  Diagnosis Date  . Allergy   . Anxiety   . Bipolar disorder [facility])    hospitalized 2010, 2013  . Diabetes mellitus without complication ([place]) 0865   type 2  . GERD (gastroesophageal reflux disease)   . History of cardiovascular stress test 2019   after chest pain, reportedly normal per patient 05/2018  . History of cold sores   . Hypertension   . Joint pain    right wrist, chronic pain  . Migraines    since childhood  . Preeclampsia 2013   with acute kidney failure, acute tubular necrosis, dialysis 1.5 mo, was in ICU  . Pyelonephritis 2013  . Vitamin D deficiency   . Wears glasses    Current Outpatient Medications on File Prior to Visit  Medication Sig Dispense Refill  . buPROPion (WELLBUTRIN XL) 150 MG 24 hr tablet Take 150 mg by mouth daily.    . Cholecalciferol (VITAMIN D3) 1.25 MG (50000 UT) CAPS TAKE 1 CAPSULE BY MOUTH ONE TIME PER WEEK 4 capsule 0  . omeprazole (PRILOSEC) 40 MG capsule Take 40 mg by mouth daily.    . Rivaroxaban 15 & 20 MG TBPK Follow package directions: Take one 15mg  tablet by mouth twice a day. On day 22, switch to one 20mg  tablet once a day. Take with food. 51 each 0  . cholecalciferol (VITAMIN D3) 25 MCG (1000 UT) tablet Take 1 tablet (1,000 Units total) by mouth  daily. (Patient not taking: Reported on 08/11/2019) 90 tablet 3  . valACYclovir (VALTREX) 1000 MG tablet Take 1,000 mg by mouth as needed.     No current facility-administered medications on file prior to visit.   ROS as in subjective     Objective: Ht 5' (1.524 m)   Wt 190 lb (86.2 kg)   BMI 37.11 kg/m   Not examined in person as this was a virtual consult   Assessment: Encounter Diagnoses  Name Primary?  . Acute deep vein thrombosis (DVT) of proximal vein of right lower extremity (HCC) Yes  . Leg pain, diffuse, right   . Family history of clotting disorder   . History of miscarriage   . History of atrial flutter      Plan: I  reviewed her emergency department notes from yesterday's visit including positive DVT on ultrasound.  We discussed potential risk factor.  Discussed treatment.  Discussed possible complications.  She just started Xarelto yesterday.  Discussed risk and benefits and proper use of medication.  We discussed complete rest in the next 3 to 4 days through the weekend then gradual return to walking limited the first week or 2.  Advise she begin compression hose over-the-counter after the first 4 to 5 days.  Avoid NSAIDs.  We discussed potential risk factors.  I suspect underlying clotting disorder given family history and unprovoked DVT.  After 4 to 6 months of therapy, we will need to do a hypercoagulable lab panel  She will return soon for physical  I asked her to call back in 4 to 5 days to give me an update on symptoms  Pain medicine as below.  Discussed risk,  Benefits,  proper use of pain medicine.  Aubria was seen today for follow-up.  Diagnoses and all orders for this visit:  Acute deep vein thrombosis (DVT) of proximal vein of right lower extremity (HCC)  Leg pain, diffuse, right  Family history of clotting disorder  History of miscarriage  History of atrial flutter  Other orders -     HYDROcodone-acetaminophen (NORCO) 7.5-325 MG tablet; Take 1 tablet by mouth every 6 (six) hours as needed for up to 5 days for moderate pain.    F/u 4-5 days

## 2019-08-11 NOTE — Telephone Encounter (Signed)
She sent my chart message.  Please set up for in person or virtual consult today if possible ASAP due to her request for pain medicaiton and diagnosis of DVT blood clot

## 2019-08-11 NOTE — ED Provider Notes (Signed)
[facility] Emergency Department Provider Note  ____________________________________________  Time seen: Approximately 2:22 AM  I have reviewed the triage vital signs and the nursing notes.   HISTORY  Chief Complaint calf pain   HPI Lashawnda Marquard is a 44 y.o. female presents for evaluation of right lower extremity pain.  Patient reports that she has had pain intermittently for about 3 weeks.  Sharp located in the right calf.  Over the last 24 hours she started feeling like her leg was very heavy and the pain became more severe which prompted her to do a telemedicine consult.  She was recommended to come to the emergency room.  Patient denies any personal or family history of PE or DVT, recent travel immobilization, hemoptysis or exogenous hormones.  She does report that she sits for several hours sometimes up to 16 hours a day behind a desk at work.  She was previously on Xarelto for an episode of atrial flutter however she discontinued it about a 1 year ago because she was unable to afforded.  She denies chest pain but has noted over the last 2 to 3 days that she feels a little bit more winded when walking.  No shortness of breath at rest.   Past Medical History:  Diagnosis Date  . Allergy   . Anxiety   . Bipolar disorder [facility])    hospitalized 2010, 2013  . Diabetes mellitus without complication (HCC) 2017   type 2  . GERD (gastroesophageal reflux disease)   . History of cardiovascular stress test 2019   after chest pain, reportedly normal per patient 05/2018  . History of cold sores   . Hypertension   . Joint pain    right wrist, chronic pain  . Migraines    since childhood  . Preeclampsia 2013   with acute kidney failure, acute tubular necrosis, dialysis 1.5 mo, was in ICU  . Pyelonephritis 2013  . Vitamin D deficiency   . Wears glasses     Patient Active Problem List   Diagnosis Date Noted  . Essential hypertension 06/23/2018  . Allergic  rhinitis 06/23/2018  . Anxiety 06/23/2018  . Depressed mood 06/23/2018  . Bipolar disorder (HCC) 06/23/2018  . Gastroesophageal reflux disease without esophagitis 06/23/2018  . Migraine without status migrainosus, not intractable 06/23/2018  . Chronic wrist pain, right 06/23/2018  . Vitamin D deficiency 06/23/2018  . Diabetes mellitus (HCC) 06/23/2018  . Vaccine counseling 06/23/2018  . Screen for STD (sexually transmitted disease) 06/23/2018  . Screening for cervical cancer 06/23/2018    Past Surgical History:  Procedure Laterality Date  . CARPAL TUNNEL RELEASE  2014  . CESAREAN SECTION     x4  . DE QUERVAIN'S RELEASE  2018  . 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 ORIF WRIST FRACTURE  2014   right  . TUBAL LIGATION    . WRIST SURGERY  2018   hardware removal     Prior to Admission medications   Medication Sig Start Date End Date Taking? Authorizing Provider  Cholecalciferol (VITAMIN D3) 1.25 MG (50000 UT) CAPS TAKE 1 CAPSULE BY MOUTH ONE TIME PER WEEK 07/26/19   Tysinger, Kermit Balo, PA-C  cholecalciferol (VITAMIN D3) 25 MCG (1000 UT) tablet Take 1 tablet (1,000 Units total) by mouth daily. 06/23/18   Tysinger, Kermit Balo, PA-C  omeprazole (PRILOSEC) 40 MG capsule Take 40 mg by mouth daily.    [provider]  Rivaroxaban 15 & 20 MG TBPK Follow  package directions: Take one 15mg  tablet by mouth twice a day. On day 22, switch to one 20mg  tablet once a day. Take with food. 08/11/19   Nita SickleVeronese, Dawson, MD  valACYclovir (VALTREX) 1000 MG tablet Take 1,000 mg by mouth as needed.    [provider]    Allergies Nsaids, Penicillins, and Promethazine  Family History  Problem Relation Age of Onset  . Stroke Mother   . Diabetes Mother   . Multiple sclerosis Mother   . Hypertension Mother   . Diabetes Father   . Hypertension Father   . Cancer Father        liver  . Diabetes Sister   . Hypertension Sister   . Bipolar disorder Sister   . Heart disease Maternal  Grandmother   . Diabetes Maternal Grandmother   . Kidney disease Maternal Grandmother   . Hypertension Maternal Grandmother   . Heart disease Maternal Grandfather   . Hypertension Maternal Grandfather   . Cancer Maternal Grandfather        prostate  . Diabetes Sister   . Hypertension Sister   . Bipolar disorder Sister     Social History Social History   Tobacco Use  . Smoking status: Former Smoker    Packs/day: 0.50    Years: 10.00    Pack years: 5.00    Quit date: 06/23/2016    Years since quitting: 3.1  . Smokeless tobacco: Never Used  Substance Use Topics  . Alcohol use: Yes    Comment: occasional wine  . Drug use: Not Currently    Comment: prior marijuana    Review of Systems Constitutional: Negative for fever. Eyes: Negative for visual changes. ENT: Negative for sore throat. Neck: No neck pain  Cardiovascular: Negative for chest pain. Respiratory: Negative for shortness of breath. + DOE Gastrointestinal: Negative for abdominal pain, vomiting or diarrhea. Genitourinary: Negative for dysuria. Musculoskeletal: Negative for back pain. + RLE pain Skin: Negative for rash. Neurological: Negative for headaches, weakness or numbness. Psych: No SI or HI  ____________________________________________   PHYSICAL EXAM:  VITAL SIGNS: ED Triage Vitals  Enc Vitals Group     BP 08/10/19 2031 138/90     Pulse Rate 08/10/19 2031 89     Resp --      Temp 08/10/19 2031 98.3 F (36.8 C)     Temp Source 08/10/19 2031 Oral     SpO2 08/10/19 2031 100 %     Weight 08/10/19 2032 190 lb (86.2 kg)     Height 08/10/19 2032 5' (1.524 m)     Head Circumference --      Peak Flow --      Pain Score 08/10/19 2037 6     Pain Loc --      Pain Edu? --      Excl. in GC? --     Constitutional: Alert and oriented. Well appearing and in no apparent distress. HEENT:      Head: Normocephalic and atraumatic.         Eyes: Conjunctivae are normal. Sclera is non-icteric.        Mouth/Throat: Mucous membranes are moist.       Neck: Supple with no signs of meningismus. Cardiovascular: Regular rate and rhythm. No murmurs, gallops, or rubs.  Respiratory: Normal respiratory effort. Lungs are clear to auscultation bilaterally. No wheezes, crackles, or rhonchi.  Gastrointestinal: Soft, non tender, and non distended with positive bowel sounds. No rebound or guarding. Musculoskeletal: No pitting edema, cyanosis, erythema  of her extremities.  The right calf looks slightly bigger than the left.   Neurologic: Normal speech and language. Face is symmetric. Moving all extremities. No gross focal neurologic deficits are appreciated. Skin: Skin is warm, dry and intact. No rash noted. Psychiatric: Mood and affect are normal. Speech and behavior are normal.  ____________________________________________   LABS (all labs ordered are listed, but only abnormal results are displayed)  Labs Reviewed  COMPREHENSIVE METABOLIC PANEL - Abnormal; Notable for the following components:      Result Value   Creatinine, Ser 1.01 (*)    AST 49 (*)    ALT 89 (*)    All other components within normal limits  CBC WITH DIFFERENTIAL/PLATELET - Abnormal; Notable for the following components:   Hemoglobin 11.4 (*)    HCT 35.5 (*)    All other components within normal limits  PROTIME-INR  BRAIN NATRIURETIC PEPTIDE  POC URINE PREG, ED  POCT PREGNANCY, URINE  TROPONIN I (HIGH SENSITIVITY)   ____________________________________________  EKG  ED ECG REPORT I, Nita Sickle, the attending physician, personally viewed and interpreted this ECG.  Normal sinus rhythm, rate of 66, normal intervals, normal axis, no ST elevations or depressions.  Normal EKG.  ____________________________________________  RADIOLOGY  I have personally reviewed the images performed during this visit and I agree with the Radiologist's read.   Interpretation by Radiologist:  CT Angio Chest PE W and/or Wo  Contrast  Result Date: 08/11/2019 CLINICAL DATA:  Calf pain for 3 weeks, worsening today EXAM: CT ANGIOGRAPHY CHEST WITH CONTRAST TECHNIQUE: Multidetector CT imaging of the chest was performed using the standard protocol during bolus administration of intravenous contrast. Multiplanar CT image reconstructions and MIPs were obtained to evaluate the vascular anatomy. CONTRAST:  72mL OMNIPAQUE IOHEXOL 350 MG/ML SOLN COMPARISON:  None. FINDINGS: Cardiovascular: Borderline opacification of the pulmonary arteries (HU 200) may limit detection of smaller segmental and subsegmental pulmonary arterial filling defects. No large central or lobar pulmonary artery emboli are seen. Central pulmonary arteries are normal caliber. Normal heart size. No pericardial effusion. The aortic root is suboptimally assessed given cardiac pulsation artifact. The aorta is normal caliber. No intramural hematoma, dissection flap or other acute luminal abnormality of the aorta is seen. No periaortic stranding or hemorrhage. Shared origin of the brachiocephalic and left common carotid artery. Proximal great vessels are otherwise unremarkable. Mediastinum/Nodes: No mediastinal fluid or gas. 1.5 cm hypoattenuating thyroid nodule in the left lobe thyroid gland. Right gland and thoracic inlet are unremarkable. No acute abnormality of the trachea. Small hiatal hernia including portion of the proximal suture line of the patient's sleeve gastrectomy. No worrisome mediastinal, hilar or axillary adenopathy. Lungs/Pleura: No consolidation, features of edema, pneumothorax, or effusion. No suspicious pulmonary nodules or masses. Upper Abdomen: No acute abnormalities present in the visualized portions of the upper abdomen. Musculoskeletal: Postsurgical changes from prior sleeve gastrectomy with hiatal hernia, as above. No acute abnormalities present in the visualized portions of the upper abdomen. Review of the MIP images confirms the above findings.  IMPRESSION: 1. No CT evidence of acute central or lobar pulmonary artery emboli. Borderline low opacification of the pulmonary arteries may limit more distal evaluation. 2. No acute intrathoracic abnormality identified. 3. Postsurgical changes from prior sleeve gastrectomy with small hiatal hernia. 4. 1.5 cm hypoattenuating left thyroid nodule. This follows consensus guidelines: Managing Incidental Thyroid Nodules Detected on Imaging: White Paper of the ACR Incidental Thyroid Findings Committee. J Am Coll Radiol 2015; 12:143-150. and [facility] 3-tiered system for managing  ITNs: J Am Coll Radiol. 2015; Feb;12(2): 143-50 Electronically Signed   By: Kreg Shropshire M.D.   On: 08/11/2019 02:12   US Venous Img Lower Unilateral Right (DVT)  Result Date: 08/10/2019 CLINICAL DATA:  44 year old female with right lower extremity pain and numbness. EXAM: Right LOWER EXTREMITY VENOUS DOPPLER ULTRASOUND TECHNIQUE: Gray-scale sonography with graded compression, as well as color Doppler and duplex ultrasound were performed to evaluate the lower extremity deep venous systems from the level of the common femoral vein and including the common femoral, femoral, profunda femoral, popliteal and calf veins including the posterior tibial, peroneal and gastrocnemius veins when visible. The superficial great saphenous vein was also interrogated. Spectral Doppler was utilized to evaluate flow at rest and with distal augmentation maneuvers in the common femoral, femoral and popliteal veins. COMPARISON:  None. FINDINGS: Contralateral Common Femoral Vein: Respiratory phasicity is normal and symmetric with the symptomatic side. No evidence of thrombus. Normal compressibility. Common Femoral Vein: No evidence of thrombus. Normal compressibility, respiratory phasicity and response to augmentation. Saphenofemoral Junction: No evidence of thrombus. Normal compressibility and flow on color Doppler imaging. Profunda Femoral Vein: No evidence of thrombus.  Normal compressibility and flow on color Doppler imaging. Femoral Vein: No evidence of thrombus. Normal compressibility, respiratory phasicity and response to augmentation. Popliteal Vein: No evidence of thrombus. Normal compressibility, respiratory phasicity and response to augmentation. Calf Veins: There is occlusive thrombus in the peroneal vein. Superficial Great Saphenous Vein: No evidence of thrombus. Normal compressibility. Venous Reflux:  None. Other Findings:  None. IMPRESSION: Occlusive thrombus in the peroneal vein. The remainder of the visualized veins of the right lower extremity appear patent. These results were called by telephone at the time of interpretation on 08/10/2019 at 9:37 pm to provider Siadecki , who verbally acknowledged these results. Electronically Signed   By: Elgie Collard M.D.   On: 08/10/2019 21:42     ____________________________________________   PROCEDURES  Procedure(s) performed: None Procedures Critical Care performed:  None ____________________________________________   INITIAL IMPRESSION / ASSESSMENT AND PLAN / ED COURSE   44 y.o. female presents for evaluation of right lower extremity pain x 3 weeks.  Also complaining of mild dyspnea on exertion over the last few days.  No prior history of DVT or PE.  Right calf looks slightly bigger than the left but no pitting edema, erythema or cyanosis.  Venous Doppler positive for peroneal DVT on the right.  Due to complains of dyspnea and exertion patient was sent for CT angiogram of the chest which is negative for PE.  EKG, troponin and BMP are within normal limits. Rhythm strip in the room evaluated by me showing NSR. Patient wast started on Xarelto in the ED and provided with a 30-day coupon. Recommended close follow-up with PCP for coagulopathy evaluation and assistance with obtaining her medication.  I explained to the patient that she will have to be on this medication for at least 3 to 6 months and may be taken  off of it by her PCP after a coagulopathy work-up.  Recommended not stopping the medicine until cleared by her PCP.  Discussed return precautions for worsening pain or swelling of her right lower extremity, chest pain or shortness of breath.  I have reviewed patient's previous medical records and PMH.     _____________________________________________ Please note:  Patient was evaluated in Emergency Department today for the symptoms described in the history of present illness. Patient was evaluated in the context of the global COVID-19 pandemic, which necessitated  consideration that the patient might be at risk for infection with the SARS-CoV-2 virus that causes COVID-19. Institutional protocols and algorithms that pertain to the evaluation of patients at risk for COVID-19 are in a state of rapid change based on information released by regulatory bodies including the CDC and federal and state organizations. These policies and algorithms were followed during the patient's care in the ED.  Some ED evaluations and interventions may be delayed as a result of limited staffing during the pandemic.   ____________________________________________   FINAL CLINICAL IMPRESSION(S) / ED DIAGNOSES   Final diagnoses:  Acute deep vein thrombosis (DVT) of right peroneal vein (HCC)      NEW MEDICATIONS STARTED DURING THIS VISIT:  ED Discharge Orders         Ordered    Rivaroxaban 15 & 20 MG TBPK  Status:  Discontinued     08/11/19 0218    Rivaroxaban 15 & 20 MG TBPK     08/11/19 0228           Note:  This document was prepared using Dragon voice recognition software and may include unintentional dictation errors.    Alfred Levins, [place], MD 08/11/19 0230

## 2019-08-11 NOTE — Discharge Instructions (Addendum)
Take Xarelto as prescribed. Follow up with your doctor for further evaluation.  You may need to be on this for at least 3 months but do not stop until you are cleared by your doctor.  Return to the emergency room if you have any chest pain or shortness of breath as these may be signs of the blood clot has moved to your lungs and it could be a very dangerous problem.

## 2019-08-14 ENCOUNTER — Ambulatory Visit: Payer: BC Managed Care – PPO | Admitting: Medical

## 2019-08-16 ENCOUNTER — Ambulatory Visit: Payer: BLUE CROSS/BLUE SHIELD | Admitting: Medical

## 2019-08-17 ENCOUNTER — Other Ambulatory Visit: Payer: Self-pay | Admitting: Medical

## 2019-08-17 ENCOUNTER — Encounter: Payer: Self-pay | Admitting: Medical

## 2019-08-17 ENCOUNTER — Other Ambulatory Visit: Payer: Self-pay

## 2019-08-17 ENCOUNTER — Ambulatory Visit (INDEPENDENT_AMBULATORY_CARE_PROVIDER_SITE_OTHER): Payer: BC Managed Care – PPO | Admitting: Medical

## 2019-08-17 VITALS — BP 170/92 | HR 85 | Temp 98.2°F | Ht 60.0 in | Wt 194.4 lb

## 2019-08-17 DIAGNOSIS — R0602 Shortness of breath: Secondary | ICD-10-CM

## 2019-08-17 DIAGNOSIS — I824Y1 Acute embolism and thrombosis of unspecified deep veins of right proximal lower extremity: Secondary | ICD-10-CM | POA: Diagnosis not present

## 2019-08-17 DIAGNOSIS — Z8679 Personal history of other diseases of the circulatory system: Secondary | ICD-10-CM | POA: Diagnosis not present

## 2019-08-17 DIAGNOSIS — Z9851 Tubal ligation status: Secondary | ICD-10-CM | POA: Diagnosis not present

## 2019-08-17 DIAGNOSIS — Z8759 Personal history of other complications of pregnancy, childbirth and the puerperium: Secondary | ICD-10-CM

## 2019-08-17 DIAGNOSIS — R42 Dizziness and giddiness: Secondary | ICD-10-CM | POA: Insufficient documentation

## 2019-08-17 DIAGNOSIS — R0600 Dyspnea, unspecified: Secondary | ICD-10-CM | POA: Insufficient documentation

## 2019-08-17 DIAGNOSIS — E559 Vitamin D deficiency, unspecified: Secondary | ICD-10-CM

## 2019-08-17 DIAGNOSIS — R0609 Other forms of dyspnea: Secondary | ICD-10-CM | POA: Insufficient documentation

## 2019-08-17 DIAGNOSIS — R030 Elevated blood-pressure reading, without diagnosis of hypertension: Secondary | ICD-10-CM

## 2019-08-17 HISTORY — DX: Tubal ligation status: Z98.51

## 2019-08-17 HISTORY — DX: Dizziness and giddiness: R42

## 2019-08-17 MED ORDER — METOPROLOL TARTRATE 25 MG PO TABS: 25.0000 mg | ORAL_TABLET | Freq: Two times a day (BID) | ORAL | 0 refills | Status: DC

## 2019-08-17 NOTE — Progress Notes (Signed)
Subjective: Chief Complaint  Patient presents with  . Follow-up    DVT with nausea    Here for DVT f/u.   She was seen in the emergency dept 08/11/2019, diagnosed with unprovoked DVT right leg, DVT of peroneal vein.  She was started on Xarelto which she is taking.  We talked last week with televist and she was doing ok other than leg pain.   In the last few days though has new symptoms.   She notes she feels very tired, winded, has episodes of dizziness . Chest feels like it did when she got diagnosed with atrial flutter in past.   Feels worse if stands up fast, gets palpitations at times.   This morning every time she bared down with bowel movement, felt heart seemed to be funny.    She notes history atrial flutter in 2018.   Feels like she felt back then.    She has been on BP medication with prior pregnancies and with atrial flutter in past .  Has hx/o pre-eclampsia, and had eclampsia once.   BPs usually 120-130/70-80s prior to this weekend.      She has a Psychologist, educational and this weekend one reading showed abnormal.   Home BP 190/120 yesterday  No other aggravating or relieving factors. No other complaint.   Past Medical History:  Diagnosis Date  . Allergy   . Anxiety   . Bipolar disorder [facility])    hospitalized 2010, 2013  . Diabetes mellitus without complication (HCC) 2017   type 2  . GERD (gastroesophageal reflux disease)   . History of cardiovascular stress test 2019   after chest pain, reportedly normal per patient 05/2018  . History of cold sores   . Hypertension   . Joint pain    right wrist, chronic pain  . Migraines    since childhood  . Preeclampsia 2013   with acute kidney failure, acute tubular necrosis, dialysis 1.5 mo, was in ICU  . Pyelonephritis 2013  . Vitamin D deficiency   . Wears glasses     Current Outpatient Medications on File Prior to Visit  Medication Sig Dispense Refill  . buPROPion (WELLBUTRIN XL) 150 MG 24 hr tablet Take 150 mg by mouth daily.     . Cholecalciferol (VITAMIN D3) 1.25 MG (50000 UT) CAPS TAKE 1 CAPSULE BY MOUTH ONE TIME PER WEEK 4 capsule 0  . Omeprazole 20 MG TBDD     . Rivaroxaban 15 & 20 MG TBPK Follow package directions: Take one 15mg  tablet by mouth twice a day. On day 22, switch to one 20mg  tablet once a day. Take with food. 51 each 0   No current facility-administered medications on file prior to visit.   Family History  Problem Relation Age of Onset  . Stroke Mother   . Diabetes Mother   . Multiple sclerosis Mother   . Hypertension Mother   . Diabetes Father   . Hypertension Father   . Cancer Father        liver  . Diabetes Sister   . Hypertension Sister   . Bipolar disorder Sister   . Heart disease Maternal Grandmother   . Diabetes Maternal Grandmother   . Kidney disease Maternal Grandmother   . Hypertension Maternal Grandmother   . Heart disease Maternal Grandfather   . Hypertension Maternal Grandfather   . Cancer Maternal Grandfather        prostate  . Diabetes Sister   . Hypertension Sister   .  Bipolar disorder Sister    ROS as in subjective   Objective: BP (!) 170/92   Pulse 85   Temp 98.2 F (36.8 C)   Ht 5' (1.524 m)   Wt 194 lb 6.4 oz (88.2 kg)   SpO2 99%   BMI 37.97 kg/m   Wt Readings from Last 3 Encounters:  08/17/19 194 lb 6.4 oz (88.2 kg)  08/11/19 190 lb (86.2 kg)  08/10/19 190 lb (86.2 kg)    BP Readings from Last 3 Encounters:  08/17/19 (!) 170/92  08/11/19 124/68  06/23/18 122/76   General appearence: alert, no distress, WD/WN, African American Neck: supple, no lymphadenopathy, no thyromegaly, no masses, no JVD or bruits Heart: RRR, normal S1, S2, no murmurs Lungs: clear, no wheezes, rhonchi, or rales Abdomen: +bs, soft, non tender, non distended, no masses, no hepatomegaly, no splenomegaly Pulses: 2+ symmetric, upper and lower extremities, normal cap refill Mild swelling or right calve asymmetrical compared to left, but only mild tenderness, no  discoloration Neuro: nonfocal exam   Reviewed Kardia mobile app on her phone showing 6 lead EKG tracings, 1 showing short run of atrial flutter    Assessment: Encounter Diagnoses  Name Primary?  . Acute deep vein thrombosis (DVT) of proximal vein of right lower extremity (HCC) Yes  . History of atrial flutter   . History of miscarriage   . H/O tubal ligation   . Dizziness   . Elevated blood-pressure reading without diagnosis of hypertension   . SOB (shortness of breath)       Plan: I reviewed her 3/ 18/2021 ED visit notes, CT chest negative for PE, doppler US showing thrombosis, reviewed the recent EKG and labs from ED visit.  Her Kardia mobile EKG shows 1 brief run of possible atrial flutter.  There were several other normal EKG tracings.  Discussed vitals signs and exam today, BP elevated.   Discussed case with Dr. Lynelle Doctor supervising physician today.    We will refer to cardiology for consult, continue Xarelto, rest, discussed relaxation as some of this could be attributed to anxiety.   Gave Metoprolol as script in the event she can't in to cardiology soon or if her symptoms worsen, or worsening aflutter findings on Kardia strips.   She was on this prior with atrial flutter in 2018.     Rebecca Mueller was seen today for follow-up.  Diagnoses and all orders for this visit:  Acute deep vein thrombosis (DVT) of proximal vein of right lower extremity (HCC) -     Hypercoagulable panel, comprehensive -     Ambulatory referral to Cardiology  History of atrial flutter -     Hypercoagulable panel, comprehensive -     Ambulatory referral to Cardiology  History of miscarriage -     Hypercoagulable panel, comprehensive -     Ambulatory referral to Cardiology  H/O tubal ligation -     Hypercoagulable panel, comprehensive -     Ambulatory referral to Cardiology  Dizziness -     Hypercoagulable panel, comprehensive -     Ambulatory referral to Cardiology  Elevated blood-pressure  reading without diagnosis of hypertension -     Hypercoagulable panel, comprehensive -     Ambulatory referral to Cardiology  SOB (shortness of breath) -     Hypercoagulable panel, comprehensive -     Ambulatory referral to Cardiology  Other orders -     metoprolol tartrate (LOPRESSOR) 25 MG tablet; Take 1 tablet (25 mg total) by mouth 2 (  two) times daily.  f/u with cardiology, labs.

## 2019-08-18 ENCOUNTER — Encounter: Payer: Self-pay | Admitting: Internal Medicine

## 2019-08-18 ENCOUNTER — Ambulatory Visit (INDEPENDENT_AMBULATORY_CARE_PROVIDER_SITE_OTHER): Payer: BC Managed Care – PPO | Admitting: Internal Medicine

## 2019-08-18 VITALS — BP 130/80 | HR 76 | Ht 60.0 in | Wt 194.0 lb

## 2019-08-18 DIAGNOSIS — R079 Chest pain, unspecified: Secondary | ICD-10-CM | POA: Diagnosis not present

## 2019-08-18 DIAGNOSIS — R0602 Shortness of breath: Secondary | ICD-10-CM | POA: Diagnosis not present

## 2019-08-18 DIAGNOSIS — R002 Palpitations: Secondary | ICD-10-CM | POA: Diagnosis not present

## 2019-08-18 DIAGNOSIS — I10 Essential (primary) hypertension: Secondary | ICD-10-CM

## 2019-08-18 DIAGNOSIS — R5383 Other fatigue: Secondary | ICD-10-CM

## 2019-08-18 DIAGNOSIS — I82451 Acute embolism and thrombosis of right peroneal vein: Secondary | ICD-10-CM

## 2019-08-18 DIAGNOSIS — Z8679 Personal history of other diseases of the circulatory system: Secondary | ICD-10-CM | POA: Diagnosis not present

## 2019-08-18 DIAGNOSIS — E119 Type 2 diabetes mellitus without complications: Secondary | ICD-10-CM

## 2019-08-18 MED ORDER — METOPROLOL TARTRATE 25 MG PO TABS: 12.5000 mg | ORAL_TABLET | Freq: Two times a day (BID) | ORAL | 2 refills | Status: DC

## 2019-08-18 NOTE — Patient Instructions (Signed)
Medication Instructions:  Your physician has recommended you make the following change in your medication:  1- DECREASE Metoprolol to 12.5 mg (0.5 tablet) by mouth two times a day.  *If you need a refill on your cardiac medications before your next appointment, please call your pharmacy*   Lab Work: Your physician recommends that you return for lab work in: TODAY - ESR, CRP, ANA, RHEUMATOID FACTOR.  If you have labs (blood work) drawn today and your tests are completely normal, you will receive your results only by: Marland Kitchen MyChart Message (if you have MyChart) OR . A paper copy in the mail If you have any lab test that is abnormal or we need to change your treatment, we will call you to review the results.   Testing/Procedures: Your physician has requested that you have an echocardiogram. Echocardiography is a painless test that uses sound waves to create images of your heart. It provides your doctor with information about the size and shape of your heart and how well your heart's chambers and valves are working. This procedure takes approximately one hour. There are no restrictions for this procedure. You may get an IV, if needed, to receive an ultrasound enhancing agent through to better visualize your heart.    Follow-Up: At [facility], you and your health needs are our priority.  As part of our continuing mission to provide you with exceptional heart care, we have created designated Provider Care Teams.  These Care Teams include your primary Cardiologist (physician) and Advanced Practice Providers (APPs -  Physician Assistants and Nurse Practitioners) who all work together to provide you with the care you need, when you need it.  We recommend signing up for the patient portal called "MyChart".  Sign up information is provided on this After Visit Summary.  MyChart is used to connect with patients for Virtual Visits (Telemedicine).  Patients are able to view lab/test results, encounter  notes, upcoming appointments, etc.  Non-urgent messages can be sent to your provider as well.   To learn more about what you can do with MyChart, go to [email or web address].    Your next appointment:   6 week(s)  The format for your next appointment:   In Person  Provider:    You may see DR Harrell Gave END or one of the following Advanced Practice Providers on your designated Care Team:    Murray Hodgkins, NP  Christell Faith, PA-C  Marrianne Mood, PA-C    Echocardiogram An echocardiogram is a procedure that uses painless sound waves (ultrasound) to produce an image of the heart. Images from an echocardiogram can provide important information about:  Signs of coronary artery disease (CAD).  Aneurysm detection. An aneurysm is a weak or damaged part of an artery wall that bulges out from the normal force of blood pumping through the body.  Heart size and shape. Changes in the size or shape of the heart can be associated with certain conditions, including heart failure, aneurysm, and CAD.  Heart muscle function.  Heart valve function.  Signs of a past heart attack.  Fluid buildup around the heart.  Thickening of the heart muscle.  A tumor or infectious growth around the heart valves. Tell a health care provider about:  Any allergies you have.  All medicines you are taking, including vitamins, herbs, eye drops, creams, and over-the-counter medicines.  Any blood disorders you have.  Any surgeries you have had.  Any medical conditions you have.  Whether you are pregnant or may be  pregnant. What are the risks? Generally, this is a safe procedure. However, problems may occur, including:  Allergic reaction to dye (contrast) that may be used during the procedure. What happens before the procedure? No specific preparation is needed. You may eat and drink normally. What happens during the procedure?   An IV tube may be inserted into one of your veins.  You may  receive contrast through this tube. A contrast is an injection that improves the quality of the pictures from your heart.  A gel will be applied to your chest.  A wand-like tool (transducer) will be moved over your chest. The gel will help to transmit the sound waves from the transducer.  The sound waves will harmlessly bounce off of your heart to allow the heart images to be captured in real-time motion. The images will be recorded on a computer. The procedure may vary among health care providers and hospitals. What happens after the procedure?  You may return to your normal, everyday life, including diet, activities, and medicines, unless your health care provider tells you not to do that. Summary  An echocardiogram is a procedure that uses painless sound waves (ultrasound) to produce an image of the heart.  Images from an echocardiogram can provide important information about the size and shape of your heart, heart muscle function, heart valve function, and fluid buildup around your heart.  You do not need to do anything to prepare before this procedure. You may eat and drink normally.  After the echocardiogram is completed, you may return to your normal, everyday life, unless your health care provider tells you not to do that. This information is not intended to replace advice given to you by your health care provider. Make sure you discuss any questions you have with your health care provider. Document Revised: 09/01/2018 Document Reviewed: 06/13/2016 Elsevier Patient Education  [place].

## 2019-08-18 NOTE — Progress Notes (Signed)
New Outpatient Visit Date: 08/18/2019  Referring Provider: Carlena Hurl, PA-C [address] [place],  [place] [address]  Chief Complaint: Fatigue, chest pain, and atrial flutter  HPI:  Rebecca Mueller is a 44 y.o. female who is being seen today for the evaluation of fatigue, dizziness, and possible atrial flutter at the request of Mr. Tysinger. 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  She presented to the [facility] emergency department on 08/11/2019 with right lower extremity pain and was diagnosed with an acute DVT involving the peroneal vein.  She was started on rivaroxaban.  She followed up with her PCP yesterday and noted fatigue, dyspnea, and dizziness, similar to when she was diagnosed with atrial flutter in the past.  She monitored her heart rhythm with cardia device, which showed 1 abnormal reading.  Blood pressures have also been very elevated at home (190/120 earlier this week).  Today, Rebecca Mueller has several concerns.  She feels very exhausted "all the time."  She reports feeling out of breath with even mild exertion such as doing routine tasks around her house.  She recent fell asleep at 7 pm and slept until 8 the next morning.  Despite sleeping for 13 hours, she still felt tired the next day.  Since she developed leg pain ~2 weeks ago and was diagnosed with RLE DVT, Rebecca Mueller has continued to have leg pain as well as palpitations.  The symptoms are similar to what she experienced [place]  She reports that echo and stress test were unrevealing.  She has used the [employer] monitor a few times over the last two weeks, with most readings indicating sinus rhythm.  One tracing, however, made note of some irregularity, which makes Rebecca Mueller concerned that she may be  having atrial flutter again.  She reports some associated chest tightness and lightheadedness.  She was given a prescription for metoprolol tartrarte 25 mg BID at her visit with Mr. Tysinger this week but has not started the medication, wanted to get our opinion first.  Rebecca Mueller is unsure of how she developed a DVT.  She notes that has been sitting quite a bit, working from home.  However, she has not traveled recently or had any surgery or other immobilization.  She is on rivaroxaban, which she took around the time of her atrial flutter diagnosis in 2018 but was unable to afford long-term therapy.  Hypercoagulable panel has been ordered by her PCP.  --------------------------------------------------------------------------------------------------  Cardiovascular History & Procedures: Cardiovascular Problems:  Atrial flutter  DVT  Risk Factors:  Hypertension, type 2 diabetes mellitus, and obesity  Cath/PCI:  None  CV Surgery:  None  EP Procedures and Devices:  None  Non-Invasive Evaluation(s):  Right lower extremity venous duplex (08/10/2019): Occlusive thrombus in the peroneal vein.  TTE (05/07/2017, [facility]): Normal LV size.  LVEF 15-72% normal diastolic function and regional wall motion.  Trace MR, TR.  Mild AI.  Recent CV Pertinent Labs: Lab Results  Component Value Date   CHOL 223 (H) 06/23/2018   HDL 95 06/23/2018   LDLCALC 110 (H) 06/23/2018   TRIG 89 06/23/2018   CHOLHDL 2.3 06/23/2018   INR 1.0 08/10/2019   BNP 18.0 08/10/2019   K 3.9 08/10/2019   BUN 13 08/10/2019   BUN 10 06/23/2018   CREATININE 1.01 (  H) 08/10/2019    --------------------------------------------------------------------------------------------------  Past Medical History:  Diagnosis Date  . Allergy   . Anxiety   . Bipolar disorder [facility])    hospitalized 2010, 2013  . Diabetes mellitus without complication ([place]) 6237   type 2  . GERD (gastroesophageal reflux disease)   .  History of cardiovascular stress test 2019   after chest pain, reportedly normal per patient 05/2018  . History of cold sores   . Hypertension   . Joint pain    right wrist, chronic pain  . Migraines    since childhood  . Preeclampsia 2013   with acute kidney failure, acute tubular necrosis, dialysis 1.5 mo, was in ICU  . Pyelonephritis 2013  . Vitamin D deficiency   . Wears glasses     Past Surgical History:  Procedure Laterality Date  . CARPAL TUNNEL RELEASE  2014  . CESAREAN SECTION     x4  . [place] RELEASE  2018  . 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 ORIF WRIST FRACTURE  2014   right  . TUBAL LIGATION    . WRIST SURGERY  2018   hardware removal     Current Meds  Medication Sig  . buPROPion (WELLBUTRIN XL) 150 MG 24 hr tablet Take 150 mg by mouth daily.  . Omeprazole 20 MG TBDD   . Rivaroxaban 15 & 20 MG TBPK Follow package directions: Take one 59m tablet by mouth twice a day. On day 22, switch to one 253mtablet once a day. Take with food.  . [DISCONTINUED] Cholecalciferol (VITAMIN D3) 1.25 MG (50000 UT) CAPS TAKE 1 CAPSULE BY MOUTH ONE TIME PER WEEK    Allergies: Metoclopramide, Nsaids, Penicillins, and Promethazine  Social History   Tobacco Use  . Smoking status: Former Smoker    Packs/day: 0.50    Years: 10.00    Pack years: 5.00    Quit date: 06/23/2016    Years since quitting: 3.1  . Smokeless tobacco: Never Used  Substance Use Topics  . Alcohol use: Not Currently  . Drug use: Not Currently    Comment: prior marijuana    Family History  Problem Relation Age of Onset  . Stroke Mother   . Diabetes Mother   . Multiple sclerosis Mother   . Hypertension Mother   . Diabetes Father   . Hypertension Father   . Cancer Father        liver  . Diabetes Sister   . Hypertension Sister   . Bipolar disorder Sister   . Heart disease Maternal Grandmother   . Diabetes Maternal Grandmother   . Kidney disease Maternal Grandmother   . Hypertension  Maternal Grandmother   . Heart disease Maternal Grandfather   . Hypertension Maternal Grandfather   . Cancer Maternal Grandfather        prostate  . Diabetes Sister   . Hypertension Sister   . Bipolar disorder Sister     Review of Systems: A 12-system review of systems was performed and was negative except as noted in the HPI.  --------------------------------------------------------------------------------------------------  Physical Exam: BP 130/80 (BP Location: Left Arm, Patient Position: Sitting, Cuff Size: Normal)   Pulse 76   Ht 5' (1.524 m)   Wt 194 lb (88 kg)   SpO2 98%   BMI 37.89 kg/m   General:  NAD. HEENT: No conjunctival pallor or scleral icterus. Facemask in place. Neck: Supple without lymphadenopathy, thyromegaly, JVD, or HJR. No carotid bruit. Lungs: Normal work  of breathing. Clear to auscultation bilaterally without wheezes or crackles. Heart: Regular rate and rhythm without murmurs, rubs, or gallops. Non-displaced PMI. Abd: Bowel sounds present. Soft, NT/ND without hepatosplenomegaly Ext: No lower extremity edema. Radial, PT, and DP pulses are 2+ bilaterally Skin: Warm and dry without rash. Neuro: CNIII-XII intact. Strength and fine-touch sensation intact in upper and lower extremities bilaterally. Psych: Normal mood and affect.  EKG:  Normal sinus rhythm without significant abnormality.  AliveCor Kardia tracing reviewed and show sinus rhythm with isoated PAC's noted on one tracing.  Lab Results  Component Value Date   WBC 6.5 08/10/2019   HGB 11.4 (L) 08/10/2019   HCT 35.5 (L) 08/10/2019   MCV 81.2 08/10/2019   PLT 312 08/10/2019    Lab Results  Component Value Date   NA 136 08/10/2019   K 3.9 08/10/2019   CL 103 08/10/2019   CO2 23 08/10/2019   BUN 13 08/10/2019   CREATININE 1.01 (H) 08/10/2019   GLUCOSE 99 08/10/2019   ALT 89 (H) 08/10/2019    Lab Results  Component Value Date   CHOL 223 (H) 06/23/2018   HDL 95 06/23/2018   LDLCALC  110 (H) 06/23/2018   TRIG 89 06/23/2018   CHOLHDL 2.3 06/23/2018     --------------------------------------------------------------------------------------------------  ASSESSMENT AND PLAN: Palpitations, chest pain, fatigue, and history of atrial flutter: Constellation of symptoms is nonspecific, with examination and EKG today being largely unremarkable.  I do not see evidence of atrial fibrillation or flutter on Rebecca Mueller's Kardia tracings.  I am concerned that a systemic process, such as inflammatory/autoimmune disease, malignancy, or obstructive sleep apnea could be contributing, given acute unprovoked DVT and significant fatigue.  I have recommended that we check an ESR, CRP, ANA, and rheumatoid factor as a starting point.  I agree with the hypercoagulable panel that Mr. Tysinger has ordered.  I have recommended that we obtain a transthoracic echocardiogram as well.  We will defer ambulatory event monitoring, as Rebecca Mueller has the [employer] at her disposal.  We will also defer repeat ischemia workup pending aforementioned testing, as my suspicion for underlying ischemic heart disease is relatively low.  With history of atrial flutter and CHADSVASC score of at least 4 (gender, hypertension, diabetes mellitus, and now thrombotic disease), as well as unprovoked DVT, she will likely need indefinite anticoagulation.  If aforementioned workup is unrevealing, we will consider referral for sleep study.  In the meantime, I think it is reasonable to start metoprolol tartrate that was prescribed by Mr. Tysinger, albeit at 12.5 mg BID.  DVT: Unprovoked, now on rivaroxaban that will likely need to be continued long-term.  Hypercoagulable panel has been ordered by Mr. Tysinger.  I would have a low threshold for referral to hematology.  Hypertension: Blood pressure is borderline today (goal < 130/80, given history of diabetes mellitus).  We will start metoprolol tartrate 12.5 mg BID today.  Type 2  diabetes mellitus: Currently diet-controlled; last A1c in our system was 5.8 in 05/2018.  Continue ongoing management per Mr. Tysinger.  Follow-up: Return clinic in 6 weeks.  Nelva Bush, MD 08/20/2019 9:57 AM

## 2019-08-19 LAB — SEDIMENTATION RATE: Sed Rate: 54 mm/hr — ABNORMAL HIGH (ref 0–32)

## 2019-08-19 LAB — ANA

## 2019-08-19 LAB — C-REACTIVE PROTEIN

## 2019-08-19 LAB — RHEUMATOID FACTOR

## 2019-08-20 ENCOUNTER — Encounter: Payer: Self-pay | Admitting: Internal Medicine

## 2019-08-20 DIAGNOSIS — R0789 Other chest pain: Secondary | ICD-10-CM | POA: Insufficient documentation

## 2019-08-20 DIAGNOSIS — R002 Palpitations: Secondary | ICD-10-CM | POA: Insufficient documentation

## 2019-08-20 DIAGNOSIS — S8011XA Contusion of right lower leg, initial encounter: Secondary | ICD-10-CM | POA: Diagnosis not present

## 2019-08-20 HISTORY — DX: Palpitations: R00.2

## 2019-08-20 HISTORY — DX: Other chest pain: R07.89

## 2019-08-21 ENCOUNTER — Telehealth: Payer: Self-pay

## 2019-08-21 DIAGNOSIS — R5383 Other fatigue: Secondary | ICD-10-CM

## 2019-08-21 DIAGNOSIS — R002 Palpitations: Secondary | ICD-10-CM

## 2019-08-21 NOTE — Telephone Encounter (Signed)
Called [facility].  Spoke with Gause.  Deanna Artis stated that there was no serum received.  Got Keisha to verify with the Req. And it states that we sent 2 SST tubes and 1 LAV tube.   I remember this patient specifically. Nilda Simmer RN, and I went through and checked with [facility] online test menu to make sure we was drawing the right tubes because there was a few test we was unsure about.

## 2019-08-28 LAB — HYPERCOAGULABLE PANEL, COMPREHENSIVE
APTT: 26.6 s
AT III Act/Nor PPP Chro: 144 % — ABNORMAL HIGH
Act. Prt C Resist w/FV Defic.: 2.9 ratio
Anticardiolipin Ab, IgG: <10 [GPL'U]
Anticardiolipin Ab, IgM: 10 [MPL'U]
Beta-2 Glycoprotein I, IgA: <10 SAU
Beta-2 Glycoprotein I, IgG: <10 SGU
Beta-2 Glycoprotein I, IgM: <10 SMU
DRVVT Confirm Seconds: 43.5 s
DRVVT Ratio: 1.2 ratio
DRVVT Screen Seconds: 58.8 s — ABNORMAL HIGH
Factor VII Antigen**: 124 %
Factor VIII Activity: 200 % — ABNORMAL HIGH
Hexagonal Phospholipid Neutral: 0 s
Homocysteine: 13.3 umol/L
Prot C Ag Act/Nor PPP Imm: 168 % — ABNORMAL HIGH
Prot S Ag Act/Nor PPP Imm: 89 %
Protein C Ag/FVII Ag Ratio**: 1.4 ratio
Protein S Ag/FVII Ag Ratio**: 0.7 ratio

## 2019-08-30 ENCOUNTER — Other Ambulatory Visit
Admission: RE | Admit: 2019-08-30 | Discharge: 2019-08-30 | Disposition: A | Discharge status: Home / Self Care | Payer: BC Managed Care – PPO | Source: Ambulatory Visit | Attending: Internal Medicine | Admitting: Internal Medicine

## 2019-08-30 ENCOUNTER — Encounter: Payer: BLUE CROSS/BLUE SHIELD | Admitting: Medical

## 2019-08-30 DIAGNOSIS — R002 Palpitations: Secondary | ICD-10-CM | POA: Diagnosis not present

## 2019-08-30 DIAGNOSIS — R5383 Other fatigue: Secondary | ICD-10-CM

## 2019-08-30 LAB — C-REACTIVE PROTEIN: CRP: 1.7 mg/dL — ABNORMAL HIGH (ref <1.0)

## 2019-08-31 LAB — ANA: Anti Nuclear Antibody (ANA): NEGATIVE

## 2019-08-31 LAB — RHEUMATOID FACTOR: Rheumatoid fact SerPl-aCnc: <10 IU/mL (ref 0.0–13.9)

## 2019-09-04 ENCOUNTER — Encounter: Payer: Self-pay | Admitting: Medical

## 2019-09-08 ENCOUNTER — Other Ambulatory Visit: Payer: Self-pay | Admitting: Medical

## 2019-09-08 DIAGNOSIS — E559 Vitamin D deficiency, unspecified: Secondary | ICD-10-CM

## 2019-09-08 NOTE — Telephone Encounter (Signed)
Does this need to be refilled t. Has not had Vit D checked in over 1 year and it was fine then.

## 2019-09-08 NOTE — Telephone Encounter (Signed)
Approve but I think she is still due for CPX.  Sees recent messages

## 2019-09-10 ENCOUNTER — Other Ambulatory Visit: Payer: Self-pay | Admitting: Medical

## 2019-09-16 ENCOUNTER — Ambulatory Visit: Payer: BC Managed Care – PPO

## 2019-09-28 ENCOUNTER — Ambulatory Visit (INDEPENDENT_AMBULATORY_CARE_PROVIDER_SITE_OTHER): Payer: BC Managed Care – PPO

## 2019-09-28 ENCOUNTER — Other Ambulatory Visit: Payer: Self-pay

## 2019-09-28 DIAGNOSIS — R079 Chest pain, unspecified: Secondary | ICD-10-CM

## 2019-09-28 DIAGNOSIS — R5383 Other fatigue: Secondary | ICD-10-CM

## 2019-09-29 ENCOUNTER — Ambulatory Visit (INDEPENDENT_AMBULATORY_CARE_PROVIDER_SITE_OTHER): Payer: BC Managed Care – PPO

## 2019-09-29 ENCOUNTER — Encounter: Payer: Self-pay | Admitting: Physician Assistant

## 2019-09-29 ENCOUNTER — Ambulatory Visit (INDEPENDENT_AMBULATORY_CARE_PROVIDER_SITE_OTHER): Payer: BC Managed Care – PPO | Admitting: Physician Assistant

## 2019-09-29 VITALS — BP 130/90 | HR 71 | Ht 60.0 in | Wt 195.0 lb

## 2019-09-29 DIAGNOSIS — R002 Palpitations: Secondary | ICD-10-CM

## 2019-09-29 DIAGNOSIS — D649 Anemia, unspecified: Secondary | ICD-10-CM

## 2019-09-29 DIAGNOSIS — E7800 Pure hypercholesterolemia, unspecified: Secondary | ICD-10-CM

## 2019-09-29 DIAGNOSIS — R55 Syncope and collapse: Secondary | ICD-10-CM

## 2019-09-29 DIAGNOSIS — Z8679 Personal history of other diseases of the circulatory system: Secondary | ICD-10-CM

## 2019-09-29 DIAGNOSIS — Z86718 Personal history of other venous thrombosis and embolism: Secondary | ICD-10-CM

## 2019-09-29 DIAGNOSIS — E119 Type 2 diabetes mellitus without complications: Secondary | ICD-10-CM

## 2019-09-29 DIAGNOSIS — I10 Essential (primary) hypertension: Secondary | ICD-10-CM

## 2019-09-29 DIAGNOSIS — R42 Dizziness and giddiness: Secondary | ICD-10-CM

## 2019-09-29 DIAGNOSIS — R61 Generalized hyperhidrosis: Secondary | ICD-10-CM

## 2019-09-29 MED ORDER — AMLODIPINE BESYLATE 5 MG PO TABS
5.0000 mg | ORAL_TABLET | Freq: Every day | ORAL | 3 refills | Status: DC
Start: 2019-09-29 — End: 2020-10-30

## 2019-09-29 NOTE — Patient Instructions (Signed)
Medication Instructions:   Your physician has recommended you make the following change in your medication:  1. START Amlodipine 5 MG - Take one tablet by mouth daily.    *If you need a refill on your cardiac medications before your next appointment, please call your pharmacy*   Lab Work:  1. Your physician recommends that you have lab work today(Liver/Lipid/cbc/bmet)  If you have labs (blood work) drawn today and your tests are completely normal, you will receive your results only by: Marland Kitchen MyChart Message (if you have MyChart) OR . A paper copy in the mail If you have any lab test that is abnormal or we need to change your treatment, we will call you to review the results.   Testing/Procedures:  1. A zio monitor was placed today. It will remain on for 14 days. You will then return monitor and event diary in provided box. It takes 1-2 weeks for report to be downloaded and returned to [place]. We will call you with the results. If monitor falls of or has orange flashing light, please call Zio for further instructions.   2. [place]  Your caregiver has ordered a Stress Test with nuclear imaging. The purpose of this test is to evaluate the blood supply to your heart muscle. This procedure is referred to as a "Non-Invasive Stress Test." This is because other than having an IV started in your vein, nothing is inserted or "invades" your body. Cardiac stress tests are done to find areas of poor blood flow to the heart by determining the extent of coronary artery disease (CAD). Some patients exercise on a treadmill, which naturally increases the blood flow to your heart, while others who are  unable to walk on a treadmill due to physical limitations have a pharmacologic/chemical stress agent called Lexiscan . This medicine will mimic walking on a treadmill by temporarily increasing your coronary blood flow.   Please note: these test may take anywhere between 2-4 hours to complete  PLEASE REPORT TO  [place] AT THE FIRST DESK WILL DIRECT YOU WHERE TO GO  Date of Procedure:_____________________________________  Arrival Time for Procedure:______________________________  Instructions regarding medication:   - You may take all your morning medication with a safe sip of water.   PLEASE NOTIFY THE OFFICE AT LEAST 67 HOURS IN ADVANCE IF YOU ARE UNABLE TO KEEP YOUR APPOINTMENT.  [phone] AND  PLEASE NOTIFY NUCLEAR MEDICINE AT [facility] AT LEAST 24 HOURS IN ADVANCE IF YOU ARE UNABLE TO KEEP YOUR APPOINTMENT. [phone]  How to prepare for your Myoview test:  1. Do not eat or drink after midnight 2. No caffeine for 24 hours prior to test 3. No smoking 24 hours prior to test. 4. Your medication may be taken with water.  If your doctor stopped a medication because of this test, do not take that medication. 5. Ladies, please do not wear dresses.  Skirts or pants are appropriate. Please wear a short sleeve shirt. 6. No perfume, cologne or lotion. 7. Wear comfortable walking shoes. No heels!    Follow-Up: At [facility], you and your health needs are our priority.  As part of our continuing mission to provide you with exceptional heart care, we have created designated Provider Care Teams.  These Care Teams include your primary Cardiologist (physician) and Advanced Practice Providers (APPs -  Physician Assistants and Nurse Practitioners) who all work together to provide you with the care you need, when you need it.  We recommend signing up for the patient portal called "MyChart".  Sign up information is provided on this After Visit Summary.  MyChart is used to connect with patients for Virtual Visits (Telemedicine).  Patients are able to view lab/test results, encounter notes, upcoming appointments, etc.  Non-urgent messages can be sent to your provider as well.   To learn more about what you can do with MyChart, go to [email or web address].    Your next  appointment:   4-6  week(s)  The format for your next appointment:   In Person  Provider:    You may see Yvonne Kendall, MD or one of the following Advanced Practice Providers on your designated Care Team:    Nicolasa Ducking, NP  Eula Listen, PA-C  Marisue Ivan, PA-C

## 2019-09-29 NOTE — Progress Notes (Signed)
Office Visit    Patient Name: Rebecca Mueller Date of Encounter: 09/29/2019  Primary Care Provider:  Carlena Hurl, PA-C Primary Cardiologist:  Nelva Bush, MD  Chief Complaint    Chief Complaint  Patient presents with  . office visit    6 week F/U-Patient reports elevated BP x 2 with nausea/sweating since last visit lasting about 15 minutes eachtime; Meds verbally reviewed with patient.    44 year old female with history of unprovoked right lower extremity DVT, atrial flutter, hypertension, DM2 (diet-controlled), migraine headaches, and bipolar disorder, and here today for follow-up.  Past Medical History    Past Medical History:  Diagnosis Date  . Allergy   . Anxiety   . Bipolar disorder [facility])    hospitalized 2010, 2013  . Diabetes mellitus without complication ([place]) 8250   type 2  . GERD (gastroesophageal reflux disease)   . History of cardiovascular stress test 2019   after chest pain, reportedly normal per patient 05/2018  . History of cold sores   . Hypertension   . Joint pain    right wrist, chronic pain  . Migraines    since childhood  . Preeclampsia 2013   with acute kidney failure, acute tubular necrosis, dialysis 1.5 mo, was in ICU  . Pyelonephritis 2013  . Vitamin D deficiency   . Wears glasses    Past Surgical History:  Procedure Laterality Date  . CARPAL TUNNEL RELEASE  2014  . CESAREAN SECTION     x4  . [place] RELEASE  2018  . 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 ORIF WRIST FRACTURE  2014   right  . TUBAL LIGATION    . WRIST SURGERY  2018   hardware removal     Allergies  Allergies  Allergen Reactions  . Metoclopramide Other (See Comments)  . Nsaids     Avoids due to hx/o sleeve gastric bypass  . Penicillins Rash  . Promethazine Other (See Comments)    Shaky legs, restless legs    History of Present Illness    Rebecca Mueller is a 44 y.o. female with PMH as above.  She presented to [facility] emergency department  08/11/2019 with right lower extremity pain and was diagnosed with acute DVT involving the peroneal vein.  She was started on Xarelto.  She followed up with her PCP on 08/17/2019 and noted fatigue, dyspnea, dizziness, and feeling similar to when she was diagnosed with atrial flutter in the past.  She is monitoring her heart rate on cardia, which showed 1 abnormal reading.  BP was noted to be elevated at home (190/20).  She was subsequently evaluated 08/18/2019 by her primary cardiologist.  At the time of that visit, she reported feeling exhausted all the time.  She felt dyspnea with even mild exertion, such as doing routine tasks around her house.  She reported continued leg pain and palpitations since first diagnosed with her DVT.  She reported some chest tightness and lightheadedness.  She was reportedly given a prescription for metoprolol tartrate 25 mg twice daily but wanted to get our opinion before starting it.  Concern was noted for possible inflammatory/autoimmune disease, malignancy, or obstructive sleep apnea. Labs showed ESR elevated, CRP mildly elevated, ANA negative, and rheumatoid factor negative.  Echo was ordered with EF nl.  Zio was deferred given she presents the alive core cardia.  Low suspicion was noted for underlying ischemic heart disease.  Possible referral to pulmonology for sleep study was noted.  It was agreed that she should start metoprolol tartrate.  Today, she returns to clinic and notes a recent episode in which she was standing and working then suddenly felt lightheaded, diaphoretic and nauseated. This episode reportedly lasted for approximately 15 minutes and improved with laying down. She checked her BP during the episode and noted it at 210/110. She did not check her Kardia at that time. She reports today that she only periodically uses her Jodelle Red and does not use it very frequently. She also  reports that she is no longer taking metoprolol tartrate after she called the office  reporting fatigue with this medication and was advised to discontinue it. She is checking her BP at home and notes it usually runs 130s/80s. She continues to note episodes of palpitations / racing HR. No CP, orthopnea, PND, significant LEE, abdominal distention, or early satiety. She continues to note fatigue. She denies any significant salt intake, medication changes, or caffeine intake. She reports today that she has a family history of elevated BP, stroke, and arrhythmia; therefore, she wants to ensure that she is OK from a cardiac standpoint. She reports one brother with a history of atrial fibrillation and another brother with history of sleep apnea. She denies any leg discomfort and has been taking her Xarelto as prescribed. No s/sx of bleeding reported. She wonders about further workup regarding possible inflammatory / autoimmune disease with most recent labs reviewed.   Home Medications    Prior to Admission medications   Medication Sig Start Date End Date Taking? Authorizing Provider  buPROPion (WELLBUTRIN XL) 150 MG 24 hr tablet Take 150 mg by mouth daily. 05/03/19   [provider]  Cholecalciferol (VITAMIN D3) 1.25 MG (50000 UT) CAPS TAKE 1 CAPSULE BY MOUTH ONE TIME PER WEEK 09/11/19   Tysinger, Camelia Eng, PA-C  metoprolol tartrate (LOPRESSOR) 25 MG tablet Take 0.5 tablets (12.5 mg total) by mouth 2 (two) times daily. 08/18/19   End, Harrell Gave, MD  Omeprazole 20 MG TBDD  07/24/19   [provider]  Rivaroxaban 15 & 20 MG TBPK Follow package directions: Take one 69m tablet by mouth twice a day. On day 22, switch to one 273mtablet once a day. Take with food. 08/11/19   VeRudene ReMD    Review of Systems    She denies chest pain, dyspnea, pnd, orthopnea, n, v, syncope, weight gain, significant LEE, or early satiety. She reports palpitations, racing HR, lightheadedness, diaphoresis, and fatigue.   All other systems reviewed and are otherwise negative except as noted  above.  Physical Exam    VS:  BP 130/90 (BP Location: Left Arm, Patient Position: Sitting, Cuff Size: Normal)   Pulse 71   Ht 5' (1.524 m)   Wt 195 lb (88.5 kg)   SpO2 98%   BMI 38.08 kg/m  , BMI Body mass index is 38.08 kg/m. GEN: Well nourished, well developed, in no acute distress. Mask in place.  HEENT: normal. Neck: Supple, no JVD, carotid bruits, or masses. Cardiac: RRR, no murmurs, rubs, or gallops. No clubbing, cyanosis, edema.  Radials/DP/PT 2+ and equal bilaterally.  Respiratory:  CTAB. Respirations regular and unlabored. GI: Soft, nontender, nondistended, BS + x 4. MS: no deformity or atrophy. Skin: warm and dry, no rash. Neuro:  Strength and sensation are intact. Psych: Normal affect.  Accessory Clinical Findings    ECG personally reviewed by me today - NSR, 71bpm, sinus arrhythmia- no acute changes.  VITALS Reviewed today   Temp Readings from  Last 3 Encounters:  08/17/19 98.2 F (36.8 C)  08/10/19 98.3 F (36.8 C) (Oral)  06/23/18 98.6 F (37 C) (Oral)   BP Readings from Last 3 Encounters:  09/29/19 130/90  08/18/19 130/80  08/17/19 (!) 170/92   Pulse Readings from Last 3 Encounters:  09/29/19 71  08/18/19 76  08/17/19 85    Wt Readings from Last 3 Encounters:  09/29/19 195 lb (88.5 kg)  08/18/19 194 lb (88 kg)  08/17/19 194 lb 6.4 oz (88.2 kg)     LABS  reviewed today   [place] present and most recent? Yes/No: No  Lab Results  Component Value Date   WBC 6.5 08/10/2019   HGB 11.4 (L) 08/10/2019   HCT 35.5 (L) 08/10/2019   MCV 81.2 08/10/2019   PLT 312 08/10/2019   Lab Results  Component Value Date   CREATININE 1.01 (H) 08/10/2019   BUN 13 08/10/2019   NA 136 08/10/2019   K 3.9 08/10/2019   CL 103 08/10/2019   CO2 23 08/10/2019   Lab Results  Component Value Date   ALT 89 (H) 08/10/2019   AST 49 (H) 08/10/2019   ALKPHOS 59 08/10/2019   BILITOT 0.7 08/10/2019   Lab Results  Component Value Date   CHOL 223 (H)  06/23/2018   HDL 95 06/23/2018   LDLCALC 110 (H) 06/23/2018   TRIG 89 06/23/2018   CHOLHDL 2.3 06/23/2018    Lab Results  Component Value Date   HGBA1C 5.8 (H) 06/23/2018   Lab Results  Component Value Date   TSH 1.410 06/23/2018     STUDIES/PROCEDURES reviewed today   Echo 09/28/19 1. Left ventricular ejection fraction, by estimation, is 60 to 65%. The  left ventricle has normal function. The left ventricle has no regional  wall motion abnormalities. Left ventricular diastolic parameters were  normal.  2. Right ventricular systolic function is normal. The right ventricular  size is normal.  3. The mitral valve is normal in structure. No evidence of mitral valve  regurgitation. No evidence of mitral stenosis.  4. The aortic valve is normal in structure. Aortic valve regurgitation is  not visualized. No aortic stenosis is present.  5. The inferior vena cava is normal in size with greater than 50%  respiratory variability, suggesting right atrial pressure of 3 mmHg.   Assessment & Plan    Essential Hypertension Family history of poorly controlled BP, stroke --Reports a family history of elevated BP and stroke. Recently, she had an episode of elevated BP that resulted in diaphoresis, lightheadedness, and nausea. Denies any change to diet (high salt foods, caffeine) or medications. Consider that this most recent episode could be 2/2 an episode of elevated pressure.  She is no longer taking metoprolol tartrate, as this made her feel more fatigued. Most recent echo as above shows nl EF.  --Start amlodipine 37m daily for BP support. She is aware may result in some LEE and will let u[place]ore[place]now if she cannot tolerate this medicaiton. BP goal of <130/80 discussed. Continue to monitor pressures at home. Low salt diet discussed. Future considerations could include addition of ACE/ARB given her comorbid DM2 with recent A1C borderline. As below, if Zio shows ectopy, we may choose to transition  her amlodipine to diltiazem. Revisit obtaining a sleep study and metanephrine labs at RTC as below.  Palpitations, Fatigue History of atrial flutter --She continues to note palpitations. EKG today with sinus arrhythmia. No significant ectopy or evidence of atrial fibrillation or flutter.  Recent episode as above could be 2/2 arrhythmia, as she did not check her Jodelle Red. She does have a personal history of atrial flutter and family history of atrial fibrillation in one brother and history of sleep apnea in another brother. 05/2018 TSH 1.410. --Obtain 2 week Zio XT to rule out arrhythmia not yet caught on EKG or her Kardia tracings. She did not tolerate metoprolol tartrate. Depending on results of Zio, could consider transitioning from amlodipine to diltiazem for more optimal control of ectopy/rate if needed. Will order a BMET to rule out electrolyte abnormality. Continue current Xarelto given h/o atrial flutter and unprovoked DVT with CHA2DS2VASc score of at least  4 (HTN, DM2, vascular, female). Will order a CBC. Consider metanephrine/pheochromocytoma labs and sleep study at RTC.  Episode of diaphoresis, nausea, and lightheadedness / presyncope --Recent episode occurring at rest and as described above in HPI. Consider as occurring 2/2 BP or ectopy / arrhythmia. Lower suspicion for most recent episode 2/2 cardiac ischemia given nl EF on recent echo; however, she does have risk factors including previous smoker, DM2, and elevated cholesterol. After long discussion with patient, will have agreed to order a Lexiscan. Lipid and liver function to be reassessed for further risk factor stratification. Consider addition of statin based on these labs. Revisit sleep study, pheochromocytoma labs, and transition from amlodipine to diltiazem at RTC.    History of unprovoked DVT --Continue current Xarelto, which will likely need continued long-term. She reports compliance and denies any s/sx of bleeding or recurrent DVT.  Will recheck CBC. Most recent ESR, CRP, ANA, RF labs nonspecific and appears further labs / workup performed per PCP. She is uncertain if her PCP plans to refer her to hematology or rheumatology in the near future.   DM2, diet controlled --A1C 5.8. Continue dietary changes. Possible ACE/ARB addition to medications in the future.  HLD --Previous 06/23/2018 labs show total cholesterol 223 and LDL 110. Will recheck lipid and liver function. Consider start of statin based on these labs.   Anemia --Reports that this is a chronic finding for her. Will order CBC as on [place]. Consider as contributing to fatigue.   Medication changes: Start amlodipine 74m. She discontinued her metoprolol tartrate.  Labs ordered: Lipid and liver, BMET, CBC. Studies / Imaging ordered: 2 week Zio XT, Lexiscan. Future considerations: Metanephrines, TSH, Sleep study, ACE/ARB, transition of amlodipine to diltiazem if needed, possible statin if needed.   Disposition: RTC 4-6 weeks.   JArvil Chaco PA-C 09/29/2019

## 2019-09-30 LAB — CBC
Hematocrit: 33.2 % — ABNORMAL LOW (ref 34.0–46.6)
Hemoglobin: 11.3 g/dL (ref 11.1–15.9)
MCH: 26.1 pg — ABNORMAL LOW (ref 26.6–33.0)
MCHC: 34 g/dL (ref 31.5–35.7)
MCV: 77 fL — ABNORMAL LOW (ref 79–97)
Platelets: 356 10*3/uL (ref 150–450)
RBC: 4.33 x10E6/uL (ref 3.77–5.28)
RDW: 14.7 % (ref 11.7–15.4)
WBC: 6.1 10*3/uL (ref 3.4–10.8)

## 2019-09-30 LAB — LIPID PANEL
Chol/HDL Ratio: 2.4 ratio (ref 0.0–4.4)
Cholesterol, Total: 216 mg/dL — ABNORMAL HIGH (ref 100–199)
HDL: 91 mg/dL (ref >39)
LDL Chol Calc (NIH): 109 mg/dL — ABNORMAL HIGH (ref 0–99)
Triglycerides: 90 mg/dL (ref 0–149)
VLDL Cholesterol Cal: 16 mg/dL (ref 5–40)

## 2019-09-30 LAB — HEPATIC FUNCTION PANEL
ALT: 12 IU/L (ref 0–32)
AST: 19 IU/L (ref 0–40)
Albumin: 4.3 g/dL (ref 3.8–4.8)
Alkaline Phosphatase: 73 IU/L (ref 39–117)
Bilirubin Total: 0.2 mg/dL (ref 0.0–1.2)
Bilirubin, Direct: 0.07 mg/dL (ref 0.00–0.40)
Total Protein: 6.9 g/dL (ref 6.0–8.5)

## 2019-09-30 LAB — BASIC METABOLIC PANEL
BUN/Creatinine Ratio: 10 (ref 9–23)
BUN: 11 mg/dL (ref 6–24)
CO2: 21 mmol/L (ref 20–29)
Calcium: 9.1 mg/dL (ref 8.7–10.2)
Chloride: 105 mmol/L (ref 96–106)
Creatinine, Ser: 1.08 mg/dL — ABNORMAL HIGH (ref 0.57–1.00)
GFR calc Af Amer: 74 mL/min/{1.73_m2} (ref >59)
GFR calc non Af Amer: 64 mL/min/{1.73_m2} (ref >59)
Glucose: 97 mg/dL (ref 65–99)
Potassium: 4.5 mmol/L (ref 3.5–5.2)
Sodium: 140 mmol/L (ref 134–144)

## 2019-10-09 ENCOUNTER — Other Ambulatory Visit: Payer: Self-pay | Admitting: Medical

## 2019-10-09 DIAGNOSIS — E559 Vitamin D deficiency, unspecified: Secondary | ICD-10-CM

## 2019-10-09 NOTE — Telephone Encounter (Signed)
Is this ok to refill pt. Last apt was 08/17/19

## 2019-10-13 ENCOUNTER — Encounter
Admission: RE | Admit: 2019-10-13 | Discharge: 2019-10-13 | Disposition: A | Discharge status: Home / Self Care | Payer: BC Managed Care – PPO | Source: Ambulatory Visit | Attending: Physician Assistant | Admitting: Physician Assistant

## 2019-10-13 ENCOUNTER — Other Ambulatory Visit: Payer: Self-pay

## 2019-10-13 DIAGNOSIS — R55 Syncope and collapse: Secondary | ICD-10-CM | POA: Insufficient documentation

## 2019-10-16 ENCOUNTER — Other Ambulatory Visit: Payer: Self-pay

## 2019-10-16 ENCOUNTER — Ambulatory Visit
Admission: RE | Admit: 2019-10-16 | Discharge: 2019-10-16 | Disposition: A | Discharge status: Home / Self Care | Payer: BC Managed Care – PPO | Source: Ambulatory Visit | Attending: Physician Assistant | Admitting: Physician Assistant

## 2019-10-16 DIAGNOSIS — R55 Syncope and collapse: Secondary | ICD-10-CM | POA: Insufficient documentation

## 2019-10-16 LAB — NM MYOCAR MULTI W/SPECT W/WALL MOTION / EF
LV dias vol: 67 mL (ref 46–106)
LV sys vol: 30 mL
Peak HR: 122 {beats}/min
Percent HR: 68 %
Rest HR: 90 {beats}/min
SDS: 7
SRS: 1
SSS: 5
TID: 1.02

## 2019-10-16 MED ORDER — TECHNETIUM TC 99M TETROFOSMIN IV KIT
10.0000 | PACK | Freq: Once | INTRAVENOUS | Status: AC | PRN
  Administered 2019-10-16: 9.86 via INTRAVENOUS

## 2019-10-16 MED ORDER — REGADENOSON 0.4 MG/5ML IV SOLN
0.4000 mg | Freq: Once | INTRAVENOUS | Status: AC
  Administered 2019-10-16: 0.4 mg via INTRAVENOUS

## 2019-10-16 MED ORDER — TECHNETIUM TC 99M TETROFOSMIN IV KIT
30.0000 | PACK | Freq: Once | INTRAVENOUS | Status: AC | PRN
  Administered 2019-10-16: 31.916 via INTRAVENOUS

## 2019-10-20 ENCOUNTER — Encounter: Payer: Self-pay | Admitting: Internal Medicine

## 2019-10-20 ENCOUNTER — Other Ambulatory Visit: Payer: Self-pay

## 2019-10-20 ENCOUNTER — Ambulatory Visit (INDEPENDENT_AMBULATORY_CARE_PROVIDER_SITE_OTHER): Payer: BC Managed Care – PPO | Admitting: Internal Medicine

## 2019-10-20 VITALS — BP 126/80 | HR 91 | Ht 60.0 in | Wt 195.0 lb

## 2019-10-20 DIAGNOSIS — R0600 Dyspnea, unspecified: Secondary | ICD-10-CM | POA: Diagnosis not present

## 2019-10-20 DIAGNOSIS — R002 Palpitations: Secondary | ICD-10-CM

## 2019-10-20 DIAGNOSIS — I4892 Unspecified atrial flutter: Secondary | ICD-10-CM | POA: Diagnosis not present

## 2019-10-20 DIAGNOSIS — G4730 Sleep apnea, unspecified: Secondary | ICD-10-CM

## 2019-10-20 DIAGNOSIS — R0789 Other chest pain: Secondary | ICD-10-CM

## 2019-10-20 DIAGNOSIS — R0609 Other forms of dyspnea: Secondary | ICD-10-CM

## 2019-10-20 NOTE — Patient Instructions (Signed)
Medication Instructions:  - Your physician recommends that you continue on your current medications as directed. Please refer to the Current Medication list given to you today.  *If you need a refill on your cardiac medications before your next appointment, please call your pharmacy*   Lab Work: - none ordered  If you have labs (blood work) drawn today and your tests are completely normal, you will receive your results only by: Marland Kitchen MyChart Message (if you have MyChart) OR . A paper copy in the mail If you have any lab test that is abnormal or we need to change your treatment, we will call you to review the results.   Testing/Procedures: - Your physician has recommended that you have a sleep study. This test records several body functions during sleep, including: brain activity, eye movement, oxygen and carbon dioxide blood levels, heart rate and rhythm, breathing rate and rhythm, the flow of air through your mouth and nose, snoring, body muscle movements, and chest and belly movement.  - You will be contacted through our [place] office to have this done.    Follow-Up: At [facility], you and your health needs are our priority.  As part of our continuing mission to provide you with exceptional heart care, we have created designated Provider Care Teams.  These Care Teams include your primary Cardiologist (physician) and Advanced Practice Providers (APPs -  Physician Assistants and Nurse Practitioners) who all work together to provide you with the care you need, when you need it.  We recommend signing up for the patient portal called "MyChart".  Sign up information is provided on this After Visit Summary.  MyChart is used to connect with patients for Virtual Visits (Telemedicine).  Patients are able to view lab/test results, encounter notes, upcoming appointments, etc.  Non-urgent messages can be sent to your provider as well.   To learn more about what you can do with MyChart, go to  [email or web address].    Your next appointment:   3 month(s)  The format for your next appointment:   In Person  Provider:    You may see Yvonne Kendall, MD or one of the following Advanced Practice Providers on your designated Care Team:    Nicolasa Ducking, NP  Eula Listen, PA-C  Marisue Ivan, PA-C  Gillian Shields, NP    Other Instructions - n/a

## 2019-10-20 NOTE — Progress Notes (Signed)
Follow-up Outpatient Visit Date: 10/20/2019  Primary Care Provider: Jac Canavan, PA-C [address] [place] [address]  Chief Complaint: Follow-up palpitations, chest pain, and shortness of breath  HPI:  Rebecca Mueller is a 44 y.o. female with history of unprovoked DVT, atrial flutter, hypertension, type 2 diabetes mellitus, migraine headaches, and bipolar disorder, who presents for follow-up of palpitations and chest pain.  She was last seen in our office 3 weeks ago, at which time she reported continued palpitations and episodes of presyncope.  She also noted intermittent chest pain.  She subsequently wore a 14-day event monitor, results of which are still pending.  She also underwent pharmacologic myocardial perfusion stress test, which was normal.  Today, Rebecca Mueller reports that her palpitations are less frequent, though she still has intermittent flutters.  Her most prominent concern today is difficulty sleeping.  Rebecca Mueller was added to her regimen by her psychiatrist yesterday..  She continues to have occasional chest discomfort, especially when climbing steps quickly.  This is also accompanied by transient dyspnea on exertion.  She wonders if deconditioning may be contributing to some of her symptoms, as she has been fairly sedentary after her DVT diagnosis.  She is no longer taking metoprolol, as it made her resting heart rate dropped into the 40s.  Rebecca Mueller recently discovered that she snores at night.  --------------------------------------------------------------------------------------------------  Cardiovascular History & Procedures: Cardiovascular Problems:  Atrial flutter  DVT  Risk Factors:  Hypertension, type 2 diabetes mellitus, and obesity  Cath/PCI:  None  CV Surgery:  None  EP Procedures and Devices:  None  Non-Invasive Evaluation(s):  Pharmacologic MPI (10/16/2019): Normal study without ischemia or scar.  Normal LVEF.  TTE (09/28/2019):  Normal LV size with LVEF 60-65% and normal diastolic function.  Normal RV size and function.  No significant valvular abnormality.  Normal CVP.  Right lower extremity venous duplex (08/10/2019): Occlusive thrombus in the peroneal vein.  TTE (05/07/2017, [place]): Normal LV size.  LVEF 55-60% normal diastolic function and regional wall motion.  Trace MR, TR.  Mild AI.   Recent CV Pertinent Labs: Lab Results  Component Value Date   CHOL 216 (H) 09/29/2019   HDL 91 09/29/2019   LDLCALC 109 (H) 09/29/2019   TRIG 90 09/29/2019   CHOLHDL 2.4 09/29/2019   INR 1.0 08/10/2019   BNP 18.0 08/10/2019   K 4.5 09/29/2019   BUN 11 09/29/2019   CREATININE 1.08 (H) 09/29/2019    Past medical and surgical history were reviewed and updated in EPIC.  Current Meds  Medication Sig  . amLODipine (NORVASC) 5 MG tablet Take 1 tablet (5 mg total) by mouth daily.  Marland Kitchen buPROPion (WELLBUTRIN XL) 150 MG 24 hr tablet Take 150 mg by mouth daily.  . Cholecalciferol (VITAMIN D3) 1.25 MG (50000 UT) CAPS TAKE 1 CAPSULE BY MOUTH ONE TIME PER WEEK  . lurasidone (LATUDA) 20 MG TABS tablet Take 20 mg by mouth daily.  . Omeprazole 20 MG TBDD daily.   . rivaroxaban (XARELTO) 20 MG TABS tablet Take 20 mg by mouth daily with supper.    Allergies: Metoclopramide, Nsaids, Penicillins, and Promethazine  Social History   Tobacco Use  . Smoking status: Former Smoker    Packs/day: 0.50    Years: 10.00    Pack years: 5.00    Quit date: 06/23/2016    Years since quitting: 3.3  . Smokeless tobacco: Never Used  Substance Use Topics  . Alcohol use: Not Currently  . Drug use:  Not Currently    Comment: prior marijuana    Family History  Problem Relation Age of Onset  . Stroke Mother   . Diabetes Mother   . Multiple sclerosis Mother   . Hypertension Mother   . Diabetes Father   . Hypertension Father   . Cancer Father        liver  . Diabetes Sister   . Hypertension Sister   . Bipolar disorder Sister   .  Heart disease Maternal Grandmother   . Diabetes Maternal Grandmother   . Kidney disease Maternal Grandmother   . Hypertension Maternal Grandmother   . Heart disease Maternal Grandfather   . Hypertension Maternal Grandfather   . Cancer Maternal Grandfather        prostate  . Diabetes Sister   . Hypertension Sister   . Bipolar disorder Sister     Review of Systems: A 12-system review of systems was performed and was negative except as noted in the HPI.  --------------------------------------------------------------------------------------------------  Physical Exam: BP 126/80 (BP Location: Left Arm, Patient Position: Sitting, Cuff Size: Normal)   Pulse 91   Ht 5' (1.524 m)   Wt 195 lb (88.5 kg)   SpO2 91%   BMI 38.08 kg/m   General: NAD. Neck: No JVD or HJR. Lungs: Clear to auscultation without wheezes or crackles. Heart: Regular rate and rhythm without murmurs, rubs, or gallops. Abdomen: Soft, nontender, nondistended. Extremities: No lower extremity edema.  Lab Results  Component Value Date   WBC 6.1 09/29/2019   HGB 11.3 09/29/2019   HCT 33.2 (L) 09/29/2019   MCV 77 (L) 09/29/2019   PLT 356 09/29/2019    Lab Results  Component Value Date   NA 140 09/29/2019   K 4.5 09/29/2019   CL 105 09/29/2019   CO2 21 09/29/2019   BUN 11 09/29/2019   CREATININE 1.08 (H) 09/29/2019   GLUCOSE 97 09/29/2019   ALT 12 09/29/2019    Lab Results  Component Value Date   CHOL 216 (H) 09/29/2019   HDL 91 09/29/2019   LDLCALC 109 (H) 09/29/2019   TRIG 90 09/29/2019   CHOLHDL 2.4 09/29/2019    --------------------------------------------------------------------------------------------------  ASSESSMENT AND PLAN: Palpitations: Improved from prior visits.  Results of 14-day event monitor are pending.  We will notify Rebecca Mueller when the results are available and discuss need for additional testing or intervention at that time.  I agree with holding metoprolol given resting  bradycardia at low doses.  Atypical chest pain and dyspnea on exertion: Likely noncardiac, given reassuring stress test and echocardiogram.  No further work-up is recommended at this time.  Atrial flutter: Exam consistent with sinus rhythm today, as noted on recent EKGs.  Continue indefinite anticoagulation in the setting of atrial flutter with a CHA2DS2-VASc score of at least 3 and unprovoked DVT.  Sleep disordered breathing: Rebecca Mueller recently discovered that she snores at night.  She is also been having trouble sleeping.  Given the patient's report of snoring and diagnosis of atrial flutter, underlying sleep disorder such as obstructive sleep apnea is a concern.  We will arrange for outpatient sleep study for further evaluation.  Follow-up: Return to clinic in 3 months.  Nelva Bush, MD 10/21/2019 3:45 PM

## 2019-10-21 ENCOUNTER — Encounter: Payer: Self-pay | Admitting: Internal Medicine

## 2019-10-21 DIAGNOSIS — G4730 Sleep apnea, unspecified: Secondary | ICD-10-CM

## 2019-10-21 DIAGNOSIS — I4892 Unspecified atrial flutter: Secondary | ICD-10-CM | POA: Insufficient documentation

## 2019-10-21 HISTORY — DX: Sleep apnea, unspecified: G47.30

## 2019-10-31 ENCOUNTER — Other Ambulatory Visit: Payer: Self-pay | Admitting: Medical

## 2019-10-31 DIAGNOSIS — E559 Vitamin D deficiency, unspecified: Secondary | ICD-10-CM

## 2019-10-31 NOTE — Telephone Encounter (Signed)
Would you like patient to continue this dosage

## 2019-11-10 ENCOUNTER — Other Ambulatory Visit: Payer: Self-pay

## 2019-11-10 ENCOUNTER — Telehealth: Payer: BC Managed Care – PPO | Admitting: Cardiology

## 2019-11-10 DIAGNOSIS — G4730 Sleep apnea, unspecified: Secondary | ICD-10-CM

## 2019-11-10 NOTE — Progress Notes (Signed)
Patient referred by Dr. Okey Dupre for a sleep study. Appointment with Dr. Mayford Knife has been cancelled for today and orders for home sleep study have been placed. Patient will follow up with Dr. Mayford Knife after sleep study.

## 2019-11-20 ENCOUNTER — Telehealth: Payer: Self-pay | Admitting: Medical

## 2019-11-20 ENCOUNTER — Telehealth: Payer: Self-pay

## 2019-11-20 NOTE — Telephone Encounter (Signed)
-----   Message from Lennon Alstrom, PA-C sent at 11/20/2019 10:51 AM EDT ----- Monitor with normal sinus rhythm with average heart rate 87bp and range 46-161bpm. Rare extra beats occurred from bottom and top of heart (PVCs, PACs). No sustained abnormal heart rhythm or pauses. Triggered events corresponded to normal heart rhythm  and PVCs. Reassuring results.

## 2019-11-20 NOTE — Progress Notes (Signed)
Monitor with normal sinus rhythm with average heart rate 87bp and range 46-161bpm. Rare extra beats occurred from bottom and top of heart (PVCs, PACs). No sustained abnormal heart rhythm or pauses. Triggered events corresponded to normal heart rhythm and PVCs. Reassuring results.

## 2019-11-20 NOTE — Telephone Encounter (Signed)
Sent patient a message on my chart

## 2019-11-20 NOTE — Telephone Encounter (Signed)
See email.  Get in for virtual or in person visit if possible tomorrow.    (if she is unable, let me know and we will come up with another plan but she is due for follow up)

## 2019-11-20 NOTE — Telephone Encounter (Signed)
Call to patient to review heart monitor result.    Pt verbalized understanding and has no further questions at this time.    Advised pt to call for any further questions or concerns.  No further orders.

## 2019-11-21 ENCOUNTER — Other Ambulatory Visit: Payer: Self-pay

## 2019-11-21 ENCOUNTER — Encounter: Payer: Self-pay | Admitting: Medical

## 2019-11-21 ENCOUNTER — Telehealth (INDEPENDENT_AMBULATORY_CARE_PROVIDER_SITE_OTHER): Payer: BC Managed Care – PPO | Admitting: Medical

## 2019-11-21 VITALS — Ht 60.0 in | Wt 195.0 lb

## 2019-11-21 DIAGNOSIS — I82459 Acute embolism and thrombosis of unspecified peroneal vein: Secondary | ICD-10-CM | POA: Insufficient documentation

## 2019-11-21 DIAGNOSIS — E559 Vitamin D deficiency, unspecified: Secondary | ICD-10-CM

## 2019-11-21 DIAGNOSIS — E119 Type 2 diabetes mellitus without complications: Secondary | ICD-10-CM | POA: Diagnosis not present

## 2019-11-21 DIAGNOSIS — Z1231 Encounter for screening mammogram for malignant neoplasm of breast: Secondary | ICD-10-CM

## 2019-11-21 DIAGNOSIS — Z8759 Personal history of other complications of pregnancy, childbirth and the puerperium: Secondary | ICD-10-CM

## 2019-11-21 DIAGNOSIS — I10 Essential (primary) hypertension: Secondary | ICD-10-CM

## 2019-11-21 DIAGNOSIS — Z8679 Personal history of other diseases of the circulatory system: Secondary | ICD-10-CM

## 2019-11-21 DIAGNOSIS — F319 Bipolar disorder, unspecified: Secondary | ICD-10-CM

## 2019-11-21 DIAGNOSIS — R0683 Snoring: Secondary | ICD-10-CM

## 2019-11-21 DIAGNOSIS — Z124 Encounter for screening for malignant neoplasm of cervix: Secondary | ICD-10-CM

## 2019-11-21 DIAGNOSIS — G4730 Sleep apnea, unspecified: Secondary | ICD-10-CM

## 2019-11-21 DIAGNOSIS — F419 Anxiety disorder, unspecified: Secondary | ICD-10-CM

## 2019-11-21 MED ORDER — LANCET DEVICES MISC: 1.0000 | Freq: Every day | 11 refills | Status: AC

## 2019-11-21 MED ORDER — GLUCOSE BLOOD VI STRP: ORAL_STRIP | 12 refills | Status: DC

## 2019-11-21 MED ORDER — RIVAROXABAN 20 MG PO TABS: 20.0000 mg | ORAL_TABLET | Freq: Every day | ORAL | 3 refills | Status: DC

## 2019-11-21 NOTE — Progress Notes (Signed)
Subjective: Chief Complaint  Patient presents with  . Medication Management    discuss meds-xarelto    Medical team: Dr. Yvonne Kendall, cardiology [facility], Dr. Clide Deutscher, psychiatry Sullivan Blasing, Kermit Balo, PA-C here for primary care  Virtual consult today to follow-up on DVT and medical problems.  She is leaving to go to [place] tomorrow on vacation for a week.  [place].    Here for DVT f/u.   She was seen in the emergency dept 08/11/2019, diagnosed with unprovoked DVT right leg, DVT of peroneal vein.  She was started on Xarelto which she is taking.  Tolerating the Xarelto just fine.  Using compression socks regularly.  No recent issues with palpitations, shortness of breath, dyspnea.  Overall doing pretty good  Hypertension-compliant with amlodipine 5 mg daily.  She has been on BP medication with prior pregnancies and with atrial flutter in past .  Has hx/o pre-eclampsia, and had eclampsia once.  She had been on metoprolol briefly a few months ago for palpations but it dropped her hypotensive.    Diabetes -currently diet controlled.  She is not checking glucose.  She has a meter but no testing supplies currently.  She sees psychiatry, on [place].   She has not yet had sleep study, referred by cardiology recently.  They had referred to pulmonology but pulmonology called and canceled the visit.   No other aggravating or relieving factors. No other complaint.   Past Medical History:  Diagnosis Date  . Acute deep vein thrombosis (DVT) of right peroneal vein (HCC) 08/11/2019  . Allergic rhinitis 06/23/2018  . Allergy   . Anxiety   . Atrial flutter (HCC) 10/21/2019  . Atypical chest pain 08/20/2019  . Bipolar disorder [facility])    hospitalized 2010, 2013  . Chronic wrist pain, right 06/23/2018  . Depressed mood 06/23/2018  . Diabetes mellitus without complication (HCC) 2017   type 2  . Dizziness 08/17/2019  . Dyspnea on exertion  08/17/2019  . Elevated blood-pressure reading without diagnosis of hypertension 08/17/2019  . Essential hypertension 06/23/2018  . Family history of clotting disorder 08/11/2019  . Gastroesophageal reflux disease without esophagitis 06/23/2018  . GERD (gastroesophageal reflux disease)   . H/O tubal ligation 08/17/2019  . History of atrial flutter 08/11/2019  . History of cardiovascular stress test 2019   after chest pain, reportedly normal per patient 05/2018  . History of cold sores   . History of miscarriage 08/11/2019  . Hypertension   . Joint pain    right wrist, chronic pain  . Leg pain, diffuse, right 08/11/2019  . Migraine without status migrainosus, not intractable 06/23/2018  . Migraines    since childhood  . Palpitations 08/20/2019  . Preeclampsia 2013   with acute kidney failure, acute tubular necrosis, dialysis 1.5 mo, was in ICU  . Pyelonephritis 2013  . Screen for STD (sexually transmitted disease) 06/23/2018  . Screening for cervical cancer 06/23/2018  . Sleep-disordered breathing 10/21/2019  . Type 2 diabetes mellitus without complication, without long-term current use of insulin (HCC) 06/23/2018  . Vitamin D deficiency   . Wears glasses     Current Outpatient Medications on File Prior to Visit  Medication Sig Dispense Refill  . amLODipine (NORVASC) 5 MG tablet Take 1 tablet (5 mg total) by mouth daily. 180 tablet 3  . buPROPion (WELLBUTRIN XL) 150 MG 24 hr tablet Take 150 mg by mouth daily.    Marland Kitchen  Cholecalciferol (VITAMIN D3) 1.25 MG (50000 UT) CAPS TAKE 1 CAPSULE BY MOUTH ONE TIME PER WEEK 4 capsule 0  . lurasidone (LATUDA) 20 MG TABS tablet Take 20 mg by mouth daily.    . Omeprazole 20 MG TBDD daily.     Marland Kitchen VRAYLAR capsule Take 1.5 mg by mouth daily.     No current facility-administered medications on file prior to visit.   Family History  Problem Relation Age of Onset  . Stroke Mother   . Diabetes Mother   . Multiple sclerosis Mother   . Hypertension Mother   .  Diabetes Father   . Hypertension Father   . Cancer Father        liver  . Diabetes Sister   . Hypertension Sister   . Bipolar disorder Sister   . Heart disease Maternal Grandmother   . Diabetes Maternal Grandmother   . Kidney disease Maternal Grandmother   . Hypertension Maternal Grandmother   . Heart disease Maternal Grandfather   . Hypertension Maternal Grandfather   . Cancer Maternal Grandfather        prostate  . Diabetes Sister   . Hypertension Sister   . Bipolar disorder Sister    Past Surgical History:  Procedure Laterality Date  . CARPAL TUNNEL RELEASE  2014  . CESAREAN SECTION     x4  . DE QUERVAIN'S RELEASE  2018  . 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 ORIF WRIST FRACTURE  2014   right  . TUBAL LIGATION    . WRIST SURGERY  2018   hardware removal     ROS as in subjective   Objective: Ht 5' (1.524 m)   Wt 195 lb (88.5 kg)   BMI 38.08 kg/m   Wt Readings from Last 3 Encounters:  11/21/19 195 lb (88.5 kg)  10/20/19 195 lb (88.5 kg)  09/29/19 195 lb (88.5 kg)    BP Readings from Last 3 Encounters:  10/20/19 126/80  09/29/19 130/90  08/18/19 130/80   General appearence: alert, no distress, WD/WN, [place] otherwise not examined in person as this was a virtual consult    Assessment: Encounter Diagnoses  Name Primary?  . Deep venous thrombosis (DVT) of peroneal vein, unspecified chronicity, unspecified laterality (HCC)   . Essential hypertension Yes  . Type 2 diabetes mellitus without complication, without long-term current use of insulin (HCC)   . Bipolar affective disorder, remission status unspecified (HCC)   . Anxiety   . Vitamin D deficiency   . History of atrial flutter   . History of miscarriage   . Sleep-disordered breathing   . Snoring   . Encounter for screening mammogram for malignant neoplasm of breast   . Screening for cervical cancer       Plan: History of DVT I reviewed Dr. Serita Kyle cardiology note from March  2021 when we referred her.  On her first visit giving the concern that she had on her home Rebecca Mueller tracing showing possible a flutter in light of her DVT, Dr. Okey Dupre was concerned about a systemic process such as inflammatory or autoimmune disease, malignancy, sleep apnea or other.   At that time they pursued sed rate, CRP, ANA and rheumatoid factor.  At this point she will continue Xarelto possibly indefinitely.  We need to rule out sleep apnea, need to update cancer screenings, and depending on where that leads [place], she may ultimately still need to see hematology  Snoring, concern for sleep study -she did  not hear back from recent ordered by cardiology so we will go ahead and make the referral again for sleep study  Cancer screening -I will have her do baseline mammogram and she will return for Pap smear with HPV DNA test soon  Diabetes-I sent testing supplies to her pharmacy, encouraged her to check her sugars as she is still not checking sugars regularly.  She is due for updated labs that she will return for in person visit after vacation  Hypertension-per the March 2021 notes metoprolol was changed to 12.5 mg twice daily but she still did not tolerate this due to low heart rate.  She is currently on amlodipine and seems to be doing fine with this.  We will check vitals when she returns for in person visit  Bipolar disorder, anxiety-continue routine follow-up with psychiatry   Azyriah was seen today for medication management.  Diagnoses and all orders for this visit:  Essential hypertension  Deep venous thrombosis (DVT) of peroneal vein, unspecified chronicity, unspecified laterality (HCC)  Type 2 diabetes mellitus without complication, without long-term current use of insulin (HCC) -     Hemoglobin A1c; Future -     Microalbumin/Creatinine Ratio, Urine; Future  Bipolar affective disorder, remission status unspecified (HCC)  Anxiety  Vitamin D deficiency  History of atrial  flutter  History of miscarriage  Sleep-disordered breathing  Snoring  Encounter for screening mammogram for malignant neoplasm of breast -     MM DIGITAL SCREENING BILATERAL; Future  Screening for cervical cancer  Other orders -     rivaroxaban (XARELTO) 20 MG TABS tablet; Take 1 tablet (20 mg total) by mouth daily with supper. -     glucose blood test strip; Use as instructed -     Lancet Devices MISC; 1 each by Does not apply route daily.   F/u after her vacation this week.

## 2019-11-21 NOTE — Progress Notes (Signed)
done

## 2019-11-21 NOTE — Progress Notes (Signed)
Order faxed.

## 2019-11-21 NOTE — Patient Instructions (Signed)
Hello Rebecca Mueller  I hope you have a wonderful vacation   I have some recommendations for you  Return soon for a physical and updated labs related to diabetes  You are due for first screening mammogram an updated Pap smear.   Please call to schedule your mammogram.   [facility]  [phone] [place], [place] [address]   I refilled Xarelto today as well as lancets and strips for glucose testing.  We have samples of Xarelto and coupon card if you need those.  Continue wearing compression hose.  Walk for exercise or other exercise regularly.   We will work on getting the sleep study set up   Check your sugar daily.  The goal is to see blood sugar less than 130 fasting.

## 2019-12-01 ENCOUNTER — Other Ambulatory Visit: Payer: Self-pay

## 2019-12-01 DIAGNOSIS — R0683 Snoring: Secondary | ICD-10-CM

## 2019-12-01 DIAGNOSIS — G4730 Sleep apnea, unspecified: Secondary | ICD-10-CM

## 2019-12-02 ENCOUNTER — Other Ambulatory Visit: Payer: Self-pay | Admitting: Medical

## 2019-12-02 DIAGNOSIS — E559 Vitamin D deficiency, unspecified: Secondary | ICD-10-CM

## 2019-12-04 NOTE — Telephone Encounter (Signed)
Should we continue patient on this dosage?

## 2019-12-08 ENCOUNTER — Other Ambulatory Visit: Payer: Self-pay | Admitting: Medical

## 2019-12-11 DIAGNOSIS — E0869 Diabetes mellitus due to underlying condition with other specified complication: Secondary | ICD-10-CM | POA: Diagnosis not present

## 2019-12-11 DIAGNOSIS — H524 Presbyopia: Secondary | ICD-10-CM | POA: Diagnosis not present

## 2019-12-11 DIAGNOSIS — E1169 Type 2 diabetes mellitus with other specified complication: Secondary | ICD-10-CM | POA: Diagnosis not present

## 2019-12-11 LAB — HM DIABETES EYE EXAM

## 2019-12-14 ENCOUNTER — Other Ambulatory Visit: Payer: Self-pay

## 2019-12-14 ENCOUNTER — Other Ambulatory Visit (HOSPITAL_COMMUNITY)
Admission: RE | Admit: 2019-12-14 | Discharge: 2019-12-14 | Disposition: A | Discharge status: Home / Self Care | Payer: BC Managed Care – PPO | Source: Ambulatory Visit | Attending: Medical | Admitting: Medical

## 2019-12-14 ENCOUNTER — Ambulatory Visit (INDEPENDENT_AMBULATORY_CARE_PROVIDER_SITE_OTHER): Payer: BC Managed Care – PPO | Admitting: Medical

## 2019-12-14 ENCOUNTER — Encounter: Payer: Self-pay | Admitting: Medical

## 2019-12-14 VITALS — BP 136/82 | HR 91 | Ht 60.0 in | Wt 197.8 lb

## 2019-12-14 DIAGNOSIS — F419 Anxiety disorder, unspecified: Secondary | ICD-10-CM

## 2019-12-14 DIAGNOSIS — J309 Allergic rhinitis, unspecified: Secondary | ICD-10-CM

## 2019-12-14 DIAGNOSIS — I82459 Acute embolism and thrombosis of unspecified peroneal vein: Secondary | ICD-10-CM | POA: Diagnosis not present

## 2019-12-14 DIAGNOSIS — Z0000 Encounter for general adult medical examination without abnormal findings: Secondary | ICD-10-CM | POA: Insufficient documentation

## 2019-12-14 DIAGNOSIS — R102 Pelvic and perineal pain: Secondary | ICD-10-CM

## 2019-12-14 DIAGNOSIS — Z8679 Personal history of other diseases of the circulatory system: Secondary | ICD-10-CM

## 2019-12-14 DIAGNOSIS — I10 Essential (primary) hypertension: Secondary | ICD-10-CM

## 2019-12-14 DIAGNOSIS — Z1231 Encounter for screening mammogram for malignant neoplasm of breast: Secondary | ICD-10-CM

## 2019-12-14 DIAGNOSIS — Z7185 Encounter for immunization safety counseling: Secondary | ICD-10-CM

## 2019-12-14 DIAGNOSIS — Z9851 Tubal ligation status: Secondary | ICD-10-CM

## 2019-12-14 DIAGNOSIS — Z832 Family history of diseases of the blood and blood-forming organs and certain disorders involving the immune mechanism: Secondary | ICD-10-CM | POA: Diagnosis not present

## 2019-12-14 DIAGNOSIS — M25531 Pain in right wrist: Secondary | ICD-10-CM

## 2019-12-14 DIAGNOSIS — Z8759 Personal history of other complications of pregnancy, childbirth and the puerperium: Secondary | ICD-10-CM

## 2019-12-14 DIAGNOSIS — Z903 Acquired absence of stomach [part of]: Secondary | ICD-10-CM

## 2019-12-14 DIAGNOSIS — F319 Bipolar disorder, unspecified: Secondary | ICD-10-CM

## 2019-12-14 DIAGNOSIS — R1022 Pelvic and perineal pain left side: Secondary | ICD-10-CM

## 2019-12-14 DIAGNOSIS — R4589 Other symptoms and signs involving emotional state: Secondary | ICD-10-CM

## 2019-12-14 DIAGNOSIS — Z7189 Other specified counseling: Secondary | ICD-10-CM

## 2019-12-14 DIAGNOSIS — Z23 Encounter for immunization: Secondary | ICD-10-CM | POA: Diagnosis not present

## 2019-12-14 DIAGNOSIS — Z124 Encounter for screening for malignant neoplasm of cervix: Secondary | ICD-10-CM

## 2019-12-14 DIAGNOSIS — K219 Gastro-esophageal reflux disease without esophagitis: Secondary | ICD-10-CM

## 2019-12-14 DIAGNOSIS — G43909 Migraine, unspecified, not intractable, without status migrainosus: Secondary | ICD-10-CM

## 2019-12-14 DIAGNOSIS — G8929 Other chronic pain: Secondary | ICD-10-CM

## 2019-12-14 DIAGNOSIS — G4730 Sleep apnea, unspecified: Secondary | ICD-10-CM

## 2019-12-14 DIAGNOSIS — E119 Type 2 diabetes mellitus without complications: Secondary | ICD-10-CM

## 2019-12-14 DIAGNOSIS — R0683 Snoring: Secondary | ICD-10-CM

## 2019-12-14 DIAGNOSIS — E559 Vitamin D deficiency, unspecified: Secondary | ICD-10-CM

## 2019-12-14 MED ORDER — HYDROCORTISONE 2.5 % EX CREA: TOPICAL_CREAM | Freq: Two times a day (BID) | CUTANEOUS | 1 refills | Status: AC

## 2019-12-14 NOTE — Progress Notes (Signed)
Subjective Chief Complaint  Patient presents with  . Annual Exam    has oatmeal for breakfast-has bipolar disorder-depression screening is 8-medication was changed 2 weeks ago     Medical team: Dr. Yvonne Kendall, cardiology [facility], Dr. Clide Deutscher, psychiatry Eye doctor, Dr. Cecile Hearing Dentist Coleen Cardiff, Kermit Balo, PA-C here for primary care  Concerns:  Eye doctor saw her recently, Had significant shift to myopia due to possible unstable diabetes, and she has astigmatism  Recently psychiatry changed [place] to [facility].  Glenyce feels like vision change was before the psychiatric med changes, but seemed to worsen after the Vryalar addition.  She notes some problem at pharmacy about coverage for glucometer.    She notes history of hemorrhoids for probably 13 years, they seem to stay inflamed.  She does get bloated at times and has to strain on the bowl sometimes.  She does get a good amount of fiber and water in her diet.  Compliant with blood pressure medicine.  She monitors her blood pressures at home.  Diastolic numbers have been running 80 to 90s but the systolic is usually normal  Diabetes-currently diet controlled  She has still not heard back yet about sleep study  No other new complaint  Past Medical History:  Diagnosis Date  . Acute deep vein thrombosis (DVT) of right peroneal vein (HCC) 08/11/2019  . Allergic rhinitis 06/23/2018  . Allergy   . Anxiety   . Atypical chest pain 08/20/2019  . Bipolar disorder [facility])    hospitalized 2010, 2013  . Chronic wrist pain, right 06/23/2018  . Depressed mood 06/23/2018  . Diabetes mellitus without complication (HCC) 2017   type 2  . Dizziness 08/17/2019  . Essential hypertension 06/23/2018  . Family history of clotting disorder 08/11/2019  . Gastroesophageal reflux disease without esophagitis 06/23/2018  . GERD (gastroesophageal reflux disease)   . H/O tubal ligation 08/17/2019  . History of atrial flutter  08/11/2019  . History of cardiovascular stress test 2019   after chest pain, reportedly normal per patient 05/2018  . History of cold sores   . History of miscarriage 08/11/2019  . Hypertension   . Joint pain    right wrist, chronic pain  . Leg pain, diffuse, right 08/11/2019  . Migraine without status migrainosus, not intractable 06/23/2018  . Migraines    since childhood  . Palpitations 08/20/2019  . Preeclampsia 2013   with acute kidney failure, acute tubular necrosis, dialysis 1.5 mo, was in ICU  . Pyelonephritis 2013  . Screen for STD (sexually transmitted disease) 06/23/2018  . Screening for cervical cancer 06/23/2018  . Sleep-disordered breathing 10/21/2019  . Vitamin D deficiency   . Wears glasses     Past Surgical History:  Procedure Laterality Date  . CARPAL TUNNEL RELEASE  2014  . CESAREAN SECTION     x4  . DE QUERVAIN'S RELEASE  2018  . 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 ORIF WRIST FRACTURE  2014   right  . TUBAL LIGATION    . WRIST SURGERY  2018   hardware removal     Family History  Problem Relation Age of Onset  . Stroke Mother   . Diabetes Mother   . Multiple sclerosis Mother   . Hypertension Mother   . Diabetes Father   . Hypertension Father   . Cancer Father        liver  . Diabetes Sister   . Hypertension Sister   . Bipolar  disorder Sister   . Heart disease Maternal Grandmother   . Diabetes Maternal Grandmother   . Kidney disease Maternal Grandmother   . Hypertension Maternal Grandmother   . Heart disease Maternal Grandfather   . Hypertension Maternal Grandfather   . Cancer Maternal Grandfather        prostate  . Diabetes Sister   . Hypertension Sister   . Bipolar disorder Sister   . Heart disease Paternal Aunt        MI, CHF  . Heart disease Paternal Uncle   . Heart disease Paternal Grandmother   . Heart disease Paternal Grandfather   . Heart disease Paternal Aunt      Current Outpatient Medications:  .  amLODipine (NORVASC) 5 MG  tablet, Take 1 tablet (5 mg total) by mouth daily., Disp: 180 tablet, Rfl: 3 .  buPROPion (WELLBUTRIN XL) 150 MG 24 hr tablet, Take 150 mg by mouth daily., Disp: , Rfl:  .  Cholecalciferol (VITAMIN D3) 1.25 MG (50000 UT) CAPS, TAKE 1 CAPSULE BY MOUTH ONE TIME PER WEEK, Disp: 4 capsule, Rfl: 0 .  glucose blood test strip, Use as instructed, Disp: 100 each, Rfl: 12 .  Lancet Devices MISC, 1 each by Does not apply route daily., Disp: 100 each, Rfl: 11 .  Omeprazole 20 MG TBDD, daily. , Disp: , Rfl:  .  rivaroxaban (XARELTO) 20 MG TABS tablet, Take 1 tablet (20 mg total) by mouth daily with supper., Disp: 90 tablet, Rfl: 3 .  VRAYLAR capsule, Take 1.5 mg by mouth daily., Disp: , Rfl:  .  hydrocortisone 2.5 % cream, Apply topically 2 (two) times daily., Disp: 30 g, Rfl: 1  Allergies  Allergen Reactions  . Metoclopramide Other (See Comments)  . Nsaids     Avoids due to hx/o sleeve gastric bypass  . Penicillins Rash  . Promethazine Other (See Comments)    Shaky legs, restless legs      Reviewed their medical, surgical, family, social, medication, and allergy history and updated chart as appropriate.   Review of Systems Constitutional: -fever, -chills, -sweats, -unexpected weight change, -decreased appetite, -fatigue Allergy: -sneezing, -itching, -congestion Dermatology: -changing moles, --rash, -lumps ENT: -runny nose, -ear pain, -sore throat, -hoarseness, -sinus pain, -teeth pain, - ringing in ears, -hearing loss, -nosebleeds Cardiology: -chest pain, -palpitations, -swelling, -difficulty breathing when lying flat, -waking up short of breath Respiratory: -cough, -shortness of breath, -difficulty breathing with exercise or exertion, -wheezing, -coughing up blood Gastroenterology: -abdominal pain, -nausea, -vomiting, -diarrhea, -constipation, -blood in stool, -changes in bowel movement, -difficulty swallowing or eating Hematology: -bleeding, -bruising  Musculoskeletal: -joint aches, -muscle  aches, -joint swelling, -back pain, -neck pain, -cramping, -changes in gait Ophthalmology: denies vision changes, eye redness, itching, discharge Urology: -burning with urination, -difficulty urinating, -blood in urine, -urinary frequency, -urgency, -incontinence Neurology: -headache, -weakness, -tingling, -numbness, -memory loss, -falls, -dizziness Psychology: -depressed mood, -agitation, -sleep problems Breast/gyn: -breast tendnerss, -discharge, -lumps, -vaginal discharge,- irregular periods, -heavy periods     Objective:  BP (!) 136/82   Pulse 91   Ht 5' (1.524 m)   Wt 197 lb 12.8 oz (89.7 kg)   SpO2 99%   BMI 38.63 kg/m   General appearance: alert, no distress, WD/WN, [place] female Skin: Unremarkable HEENT: normocephalic, conjunctiva/corneas normal, sclerae anicteric, PERRLA, EOMi, Neck: supple, no lymphadenopathy, no thyromegaly, no masses, normal ROM, no bruits Chest: non tender, normal shape and expansion Heart: RRR, normal S1, S2, no murmurs Lungs: CTA bilaterally, no wheezes, rhonchi, or rales Abdomen: +  bs, soft, non tender, non distended, no masses, no hepatomegaly, no splenomegaly, no bruits Back: non tender, normal ROM, no scoliosis Musculoskeletal: upper extremities non tender, no obvious deformity, normal ROM throughout, lower extremities non tender, no obvious deformity, normal ROM throughout Extremities: no edema, no cyanosis, no clubbing Pulses: 2+ symmetric, upper and lower extremities, normal cap refill Neurological: alert, oriented x 3, CN2-12 intact, strength normal upper extremities and lower extremities, sensation normal throughout, DTRs 2+ throughout, no cerebellar signs, gait normal Psychiatric: normal affect, behavior normal, pleasant  Breast: nontender, no masses or lumps, no skin changes, no nipple discharge or inversion, no axillary lymphadenopathy Gyn: Normal external genitalia without lesions, vagina with normal mucosa, cervix without lesions, no  cervical motion tenderness, there was mild amount of blood in the vault.  Uterus and adnexa not enlarged, left adnexal tenderness positive, otherwise nontender, no masses. Pap performed.  There was a small inflamed hair follicle just superior to clitoris, exam chaperoned by nurse. Rectal: Possible rectal skin tag, there is moderate hemorrhoids present some inflamed but none purplish or red or angry no bleeding   Assessment and Plan :   Encounter Diagnoses  Name Primary?  . Encounter for health maintenance examination in adult   . Need for pneumococcal vaccination Yes  . Essential hypertension   . Migraine without status migrainosus, not intractable, unspecified migraine type   . Deep venous thrombosis (DVT) of peroneal vein, unspecified chronicity, unspecified laterality (HCC)   . Allergic rhinitis, unspecified seasonality, unspecified trigger   . Gastroesophageal reflux disease without esophagitis   . Type 2 diabetes mellitus without complication, without long-term current use of insulin (HCC)   . Anxiety   . Bipolar affective disorder, remission status unspecified (HCC)   . Chronic wrist pain, right   . Depressed mood   . Encounter for screening mammogram for malignant neoplasm of breast   . Family history of clotting disorder   . H/O tubal ligation   . History of atrial flutter   . History of miscarriage   . Screening for cervical cancer   . Sleep-disordered breathing   . Snoring   . Vitamin D deficiency   . Vaccine counseling   . History of sleeve gastrectomy     Physical exam - discussed and counseled on healthy lifestyle, diet, exercise, preventative care, vaccinations, sick and well care, proper use of emergency dept and after hours care, and addressed their concerns.    Health screening: Advised they see their eye doctor yearly for routine vision care. Advised they see their dentist yearly for routine dental care including hygiene visits twice yearly.   Vaccines: I  recommend a yearly flu shot You are up-to-date on Covid and tetanus vaccines I recommend a baseline pneumonia vaccine called pneumococcal 23  Counseled on the pneumococcal vaccine.  Vaccine information sheet given.  Pneumococcal vaccine PPSV23 given after consent obtained.    Cancer screening: You are due for a baseline mammogram  You are due for pap with HPV testing  Colon cancer screening advised age 44yo  Discussed self skin surveillance   Significant issues Hypertension- compliant with amlodipine 5 mg daily.  She has been on BP medication with prior pregnancies and with atrial flutter in past .  Has hx/o pre-eclampsia, and had eclampsia once.  She had been on metoprolol briefly a few months ago for palpations but it dropped her hypotensive.    Coordinate with cardiology, consider increasing amlodipine or other treatment.  She has numerous primaries with heart disease  particular on maternal side.  Most of her father's kin died before age 72 due to heart issues  Palpitations-she had a recent cardiology consult.  History of atrial fibrillation 2019.  History of cardiac stress test 2019 [facility].  GERD-uses over-the-counter omeprazole, avoid triggers  Vitamin D deficiency-compliant with 50,000 units weekly.  Labs today  Type 2 diabetes-currently diet controlled.  Will need to send updated glucometer prescription, discussed routine diabetic care, daily foot checks, yearly eye doctor visit, and given the recent abnormal eye exam we may need to start medication  Statin, standard of care for diabetes, not currently on statin.  Pending labs we will recommend beginning statin  DVT, history of miscarriage, family history of clotting disorder - DVT diagnosed March 2021 unprovoked, DVT peroneal vein, has been on Xarelto since.  Uses compression socks.  Hypercoagulable panel done on March 2021.  Compliant with Xarelto.  Consider hematology consult  Snoring-still pending  sleep study ordered by cardiology recently.  There was a scheduling issue prior. referred again by [place] 10/2019.  Follow-up as planned for sleep study  History of depression, bipolar and anxiety -managed by psychiatry  Migraines-no recent issues  Hemorrhoids -counseled on acute therapy, can use steroid cream below short-term, hot bath soaks, discussed prevention.  We will likely refer to gastroenterology  Adnexal tenderness-pending Pap, consider ultrasound  Yarimar was seen today for annual exam.  Diagnoses and all orders for this visit:  Need for pneumococcal vaccination  Encounter for health maintenance examination in adult -     Vitamin B12 -     Folate -     Hemoglobin A1c -     VITAMIN D 25 Hydroxy (Vit-D Deficiency, Fractures) -     Microalbumin/Creatinine Ratio, Urine -     Cytology - PAP([place]) -     Iron  Essential hypertension  Migraine without status migrainosus, not intractable, unspecified migraine type  Deep venous thrombosis (DVT) of peroneal vein, unspecified chronicity, unspecified laterality (HCC) -     Vitamin B12 -     Folate -     Iron  Allergic rhinitis, unspecified seasonality, unspecified trigger  Gastroesophageal reflux disease without esophagitis -     Vitamin B12 -     Folate -     Iron  Type 2 diabetes mellitus without complication, without long-term current use of insulin (HCC) -     Hemoglobin A1c -     Microalbumin/Creatinine Ratio, Urine  Anxiety  Bipolar affective disorder, remission status unspecified (HCC)  Chronic wrist pain, right  Depressed mood  Encounter for screening mammogram for malignant neoplasm of breast  Family history of clotting disorder  H/O tubal ligation  History of atrial flutter  History of miscarriage  Screening for cervical cancer -     Cytology - PAP([place])  Sleep-disordered breathing  Snoring  Vitamin D deficiency -     VITAMIN D 25 Hydroxy (Vit-D Deficiency,  Fractures)  Vaccine counseling  History of sleeve gastrectomy  Other orders -     hydrocortisone 2.5 % cream; Apply topically 2 (two) times daily.  Follow-up pending labs, yearly for physical

## 2019-12-14 NOTE — Patient Instructions (Signed)
Recommendations:   Cancer screening: You should do a monthly self breast exam  Please call to schedule your first screening mammogram.   [facility]  [phone] [address] [place], [place] [address]   We updated Pap smear today.  There was some blood present.  Sometimes this interferes with the Pap result.   Colon cancer screening begins at age 42    Routine screenings See your eye doctor and dentist yearly  See [place] yearly for a physical  You should have follow-up every 6 months regarding diabetes    Specific concerns: Snoring, sleep concerns-we have made referral for sleep study.  If you have not had a phone call within the next 7 days regarding this let us know.  I am not sure what the hold-up is but we have sent requests for this.  Diabetes  I will send another request for glucometer and testing supplies  However you may need to call your insurance to find out what the preferred glucometer brand is  Check your feet daily for sores and wounds  I do recommend you start checking your blood sugars fasting in the morning  The goal is less than 130 glucose fasting  Given the eye exam issue we may need to start some medication   Bump in vagina area  There was what appeared to be a tiny hair follicle inflamed just above the clitoris  You can do hot bath soaks or do a warm compress over the area for the next few days  If the area seems to continue to be inflamed or worse you soon Neosporin or triple antibiotic ointment to the bump x  5 days   Hemorrhoids  I prescribed Hydrocortisone 2.5% cream today.  You can apply this topically to the external hemorrhoid twice daily short term for 3-5 days at a time as needed for itching, irritations, or swelling   Hemorrhoids Hemorrhoids are swollen veins in and around the rectum or anus. There are two types of hemorrhoids:  Internal hemorrhoids. These occur in the veins that  are just inside the rectum. They may poke through to the outside and become irritated and painful.  External hemorrhoids. These occur in the veins that are outside of the anus and can be felt as a painful swelling or hard lump near the anus.  Most hemorrhoids do not cause serious problems, and they can be managed with home treatments such as diet and lifestyle changes. If home treatments do not help your symptoms, procedures can be done to shrink or remove the hemorrhoids. What are the causes? This condition is caused by increased pressure in the anal area. This pressure may result from various things, including:  Constipation.  Straining to have a bowel movement.  Diarrhea.  Pregnancy.  Obesity.  Sitting for long periods of time.  Heavy lifting or other activity that causes you to strain.  Anal sex.  What are the signs or symptoms? Symptoms of this condition include:  Pain.  Anal itching or irritation.  Rectal bleeding.  Leakage of stool (feces).  Anal swelling.  One or more lumps around the anus.  How is this diagnosed? This condition can often be diagnosed through a visual exam. Other exams or tests may also be done, such as:  Examination of the rectal area with a gloved hand (digital rectal exam).  Examination of the anal canal using a small tube (anoscope).  A blood test, if you have lost a  significant amount of blood.  A test to look inside the colon (sigmoidoscopy or colonoscopy).  How is this treated? This condition can usually be treated at home. However, various procedures may be done if dietary changes, lifestyle changes, and other home treatments do not help your symptoms. These procedures can help make the hemorrhoids smaller or remove them completely. Some of these procedures involve surgery, and others do not. Common procedures include:  Rubber band ligation. Rubber bands are placed at the base of the hemorrhoids to cut off the blood supply to  them.  Sclerotherapy. Medicine is injected into the hemorrhoids to shrink them.  Infrared coagulation. A type of light energy is used to get rid of the hemorrhoids.  Hemorrhoidectomy surgery. The hemorrhoids are surgically removed, and the veins that supply them are tied off.  Stapled hemorrhoidopexy surgery. A circular stapling device is used to remove the hemorrhoids and use staples to cut off the blood supply to them.  Follow these instructions at home: Eating and drinking  Eat foods that have a lot of fiber in them, such as whole grains, beans, nuts, fruits, and vegetables. Ask your health care provider about taking products that have added fiber (fiber supplements).  Drink enough fluid to keep your urine clear or pale yellow. Managing pain and swelling  Take warm sitz baths for 20 minutes, 3-4 times a day to ease pain and discomfort.  If directed, apply ice to the affected area. Using ice packs between sitz baths may be helpful. ? Put ice in a plastic bag. ? Place a towel between your skin and the bag. ? Leave the ice on for 20 minutes, 2-3 times a day. General instructions  Take over-the-counter and prescription medicines only as told by your health care provider.  Use medicated creams or suppositories as told.  Exercise regularly.  Go to the bathroom when you have the urge to have a bowel movement. Do not wait.  Avoid straining to have bowel movements.  Keep the anal area dry and clean. Use wet toilet paper or moist towelettes after a bowel movement.  Do not sit on the toilet for long periods of time. This increases blood pooling and pain. Contact a health care provider if:  You have increasing pain and swelling that are not controlled by treatment or medicine.  You have uncontrolled bleeding.  You have difficulty having a bowel movement, or you are unable to have a bowel movement.  You have pain or inflammation outside the area of the hemorrhoids. This  information is not intended to replace advice given to you by your health care provider. Make sure you discuss any questions you have with your health care provider. Document Released: 05/08/2000 Document Revised: 10/09/2015 Document Reviewed: 01/23/2015 Elsevier Interactive Patient Education  [employer].

## 2019-12-14 NOTE — Addendum Note (Signed)
Addended by: Victorio Palm on: 12/14/2019 02:41 PM   Modules accepted: Orders

## 2019-12-15 DIAGNOSIS — R87618 Other abnormal cytological findings on specimens from cervix uteri: Secondary | ICD-10-CM | POA: Diagnosis not present

## 2019-12-15 LAB — IRON: Iron: 16 ug/dL — ABNORMAL LOW (ref 27–159)

## 2019-12-15 LAB — MICROALBUMIN / CREATININE URINE RATIO
Creatinine, Urine: 127 mg/dL
Microalb/Creat Ratio: 4 mg/g creat (ref 0–29)
Microalbumin, Urine: 5.3 ug/mL

## 2019-12-15 LAB — FOLATE: Folate: 5.1 ng/mL (ref >3.0)

## 2019-12-15 LAB — VITAMIN D 25 HYDROXY (VIT D DEFICIENCY, FRACTURES): Vit D, 25-Hydroxy: 80.6 ng/mL (ref 30.0–100.0)

## 2019-12-15 LAB — HEMOGLOBIN A1C
Est. average glucose Bld gHb Est-mCnc: 131 mg/dL
Hgb A1c MFr Bld: 6.2 % — ABNORMAL HIGH (ref 4.8–5.6)

## 2019-12-15 LAB — VITAMIN B12: Vitamin B-12: 510 pg/mL (ref 232–1245)

## 2019-12-18 ENCOUNTER — Other Ambulatory Visit: Payer: Self-pay | Admitting: Medical

## 2019-12-18 DIAGNOSIS — E559 Vitamin D deficiency, unspecified: Secondary | ICD-10-CM

## 2019-12-18 MED ORDER — FERROUS GLUCONATE 324 (38 FE) MG PO TABS
324.0000 mg | ORAL_TABLET | Freq: Every day | ORAL | 2 refills | Status: DC
Start: 2019-12-18 — End: 2020-03-12

## 2019-12-18 MED ORDER — VITAMIN D3 1.25 MG (50000 UT) PO CAPS: ORAL_CAPSULE | ORAL | 3 refills | Status: DC

## 2019-12-18 MED ORDER — ROSUVASTATIN CALCIUM 10 MG PO TABS
10.0000 mg | ORAL_TABLET | Freq: Every day | ORAL | 3 refills | Status: DC
Start: 2019-12-18 — End: 2020-12-13

## 2019-12-18 NOTE — Progress Notes (Signed)
losar

## 2019-12-19 ENCOUNTER — Other Ambulatory Visit: Payer: Self-pay

## 2019-12-19 LAB — CYTOLOGY - PAP
Comment: NEGATIVE
Diagnosis: NEGATIVE
Diagnosis: REACTIVE
High risk HPV: NEGATIVE

## 2019-12-19 MED ORDER — BLOOD GLUCOSE METER KIT: PACK | 0 refills | Status: DC

## 2019-12-20 ENCOUNTER — Other Ambulatory Visit: Payer: Self-pay | Admitting: Medical

## 2019-12-20 MED ORDER — LOSARTAN POTASSIUM 25 MG PO TABS
25.0000 mg | ORAL_TABLET | Freq: Every day | ORAL | 2 refills | Status: DC
Start: 2019-12-20 — End: 2020-03-13

## 2020-01-02 ENCOUNTER — Other Ambulatory Visit: Payer: Self-pay | Admitting: Medical

## 2020-01-02 DIAGNOSIS — Z20822 Contact with and (suspected) exposure to covid-19: Secondary | ICD-10-CM | POA: Diagnosis not present

## 2020-01-02 DIAGNOSIS — J069 Acute upper respiratory infection, unspecified: Secondary | ICD-10-CM | POA: Diagnosis not present

## 2020-01-02 DIAGNOSIS — Z1231 Encounter for screening mammogram for malignant neoplasm of breast: Secondary | ICD-10-CM

## 2020-01-04 ENCOUNTER — Other Ambulatory Visit: Payer: Self-pay

## 2020-01-04 ENCOUNTER — Encounter: Payer: Self-pay | Admitting: Medical

## 2020-01-04 ENCOUNTER — Other Ambulatory Visit: Payer: Self-pay | Admitting: Medical

## 2020-01-04 DIAGNOSIS — E559 Vitamin D deficiency, unspecified: Secondary | ICD-10-CM

## 2020-01-04 MED ORDER — BLOOD GLUCOSE METER KIT: PACK | 0 refills | Status: AC

## 2020-01-09 ENCOUNTER — Encounter: Payer: Self-pay | Admitting: Medical

## 2020-01-16 ENCOUNTER — Ambulatory Visit
Admission: RE | Admit: 2020-01-16 | Discharge: 2020-01-16 | Disposition: A | Discharge status: Home / Self Care | Payer: BC Managed Care – PPO | Source: Ambulatory Visit

## 2020-01-16 ENCOUNTER — Other Ambulatory Visit: Payer: Self-pay

## 2020-01-16 DIAGNOSIS — Z1231 Encounter for screening mammogram for malignant neoplasm of breast: Secondary | ICD-10-CM | POA: Diagnosis not present

## 2020-01-31 ENCOUNTER — Ambulatory Visit: Payer: BC Managed Care – PPO | Admitting: Internal Medicine

## 2020-02-05 ENCOUNTER — Other Ambulatory Visit: Payer: Self-pay

## 2020-02-05 ENCOUNTER — Ambulatory Visit (HOSPITAL_BASED_OUTPATIENT_CLINIC_OR_DEPARTMENT_OTHER): Payer: BC Managed Care – PPO | Attending: Medical | Admitting: Internal Medicine

## 2020-02-05 DIAGNOSIS — G4730 Sleep apnea, unspecified: Secondary | ICD-10-CM

## 2020-02-05 DIAGNOSIS — R0683 Snoring: Secondary | ICD-10-CM

## 2020-02-10 DIAGNOSIS — R0683 Snoring: Secondary | ICD-10-CM | POA: Diagnosis not present

## 2020-02-10 DIAGNOSIS — G4730 Sleep apnea, unspecified: Secondary | ICD-10-CM | POA: Diagnosis not present

## 2020-02-10 NOTE — Procedures (Signed)
   Patient Name: Rebecca Mueller, Rebecca Mueller Date: 02/05/2020 Gender: Female D.O.B: 02-07-1976 Age (years): 43 Referring Provider: Crosby Oyster Height (inches): 60 Interpreting Physician: Jetty Duhamel MD, ABSM Weight (lbs): 195 RPSGT: Elaina Pattee BMI: 38 MRN: [id number] Neck Size: 14.00  CLINICAL INFORMATION Sleep Study Type: HST Indication for sleep study: OSA Epworth Sleepiness Score: 16  SLEEP STUDY TECHNIQUE A multi-channel overnight portable sleep study was performed. The channels recorded were: nasal airflow, thoracic respiratory movement, and oxygen saturation with a pulse oximetry. Snoring was also monitored.  MEDICATIONS Patient self administered medications include: none reported  SLEEP ARCHITECTURE Patient was studied for 423 minutes. The sleep efficiency was 99.9 % and the patient was supine for 72.5%. The arousal index was 0.0 per hour.  RESPIRATORY PARAMETERS The overall AHI was 2.4 per hour, with a central apnea index of 0.0 per hour. The oxygen nadir was 90% during sleep.  CARDIAC DATA Mean heart rate during sleep was 72.1 bpm.  IMPRESSIONS - No significant obstructive sleep apnea occurred during this study (AHI = 2.4/h). - No significant central sleep apnea occurred during this study (CAI = 0.0/h). - The patient had minimal or no oxygen desaturation during the study (Min O2 = 90%) - Patient snored 8.2%.  DIAGNOSIS - Normal study  RECOMMENDATIONS - Manage for snoring and symptoms based on clinical judgment. - Sleep hygiene should be reviewed to assess factors that may improve sleep quality. - Weight management and regular exercise should be initiated or continued. - Patient may benefit from in-lab study if results don't fit with clinical impression.  [Electronically signed] 02/10/2020 11:31 AM  Jetty Duhamel MD, ABSM Diplomate, American Board of Sleep Medicine   NPI: [id number]                          Jetty Duhamel Diplomate, American Board of Sleep Medicine  ELECTRONICALLY SIGNED ON:  02/10/2020, 11:28 AM [facility] PH: [phone]   FX: [phone] ACCREDITED BY THE AMERICAN ACADEMY OF SLEEP MEDICINE

## 2020-02-23 ENCOUNTER — Encounter (HOSPITAL_BASED_OUTPATIENT_CLINIC_OR_DEPARTMENT_OTHER): Payer: BC Managed Care – PPO | Admitting: Internal Medicine

## 2020-03-06 ENCOUNTER — Ambulatory Visit: Payer: BC Managed Care – PPO | Admitting: Internal Medicine

## 2020-03-12 ENCOUNTER — Other Ambulatory Visit: Payer: Self-pay | Admitting: Medical

## 2020-03-13 ENCOUNTER — Other Ambulatory Visit: Payer: Self-pay | Admitting: Medical

## 2020-03-28 DIAGNOSIS — S46012A Strain of muscle(s) and tendon(s) of the rotator cuff of left shoulder, initial encounter: Secondary | ICD-10-CM | POA: Diagnosis not present

## 2020-04-12 ENCOUNTER — Ambulatory Visit (INDEPENDENT_AMBULATORY_CARE_PROVIDER_SITE_OTHER): Payer: BC Managed Care – PPO | Admitting: Family

## 2020-04-12 ENCOUNTER — Other Ambulatory Visit: Payer: Self-pay

## 2020-04-12 ENCOUNTER — Telehealth: Payer: Self-pay | Admitting: Medical

## 2020-04-12 ENCOUNTER — Encounter: Payer: Self-pay | Admitting: Family

## 2020-04-12 VITALS — BP 128/80 | HR 74 | Ht 60.0 in | Wt 195.1 lb

## 2020-04-12 DIAGNOSIS — Z7901 Long term (current) use of anticoagulants: Secondary | ICD-10-CM | POA: Diagnosis not present

## 2020-04-12 DIAGNOSIS — Z86718 Personal history of other venous thrombosis and embolism: Secondary | ICD-10-CM

## 2020-04-12 DIAGNOSIS — E119 Type 2 diabetes mellitus without complications: Secondary | ICD-10-CM

## 2020-04-12 DIAGNOSIS — R002 Palpitations: Secondary | ICD-10-CM | POA: Diagnosis not present

## 2020-04-12 DIAGNOSIS — G4730 Sleep apnea, unspecified: Secondary | ICD-10-CM

## 2020-04-12 DIAGNOSIS — Z8679 Personal history of other diseases of the circulatory system: Secondary | ICD-10-CM | POA: Diagnosis not present

## 2020-04-12 NOTE — Telephone Encounter (Signed)
I reviewed the cardiology notes.  Please schedule for fasting diabetes f/u in January if not already scheduled

## 2020-04-12 NOTE — Patient Instructions (Signed)
Medication Instructions:  No medication changes today.   *If you need a refill on your cardiac medications before your next appointment, please call your pharmacy*   Lab Work: None ordered today.  Testing/Procedures: Your EKG today shows normal sinus rhythm. This is a great result!  Follow-Up: At [facility], you and your health needs are our priority.  As part of our continuing mission to provide you with exceptional heart care, we have created designated Provider Care Teams.  These Care Teams include your primary Cardiologist (physician) and Advanced Practice Providers (APPs -  Physician Assistants and Nurse Practitioners) who all work together to provide you with the care you need, when you need it.  We recommend signing up for the patient portal called "MyChart".  Sign up information is provided on this After Visit Summary.  MyChart is used to connect with patients for Virtual Visits (Telemedicine).  Patients are able to view lab/test results, encounter notes, upcoming appointments, etc.  Non-urgent messages can be sent to your provider as well.   To learn more about what you can do with MyChart, go to [email or web address].    Your next appointment:   1 year(s)  The format for your next appointment:   In Person  Provider:   You may see Yvonne Kendall, MD or one of the following Advanced Practice Providers on your designated Care Team:    Nicolasa Ducking, NP  Eula Listen, PA-C  Marisue Ivan, PA-C  Cadence Fransico Michael, [place]  Gillian Shields, NP  Other Instructions  Palpitations Palpitations are feelings that your heartbeat is not normal. Your heartbeat may feel like it is:  Uneven.  Faster than normal.  Fluttering.  Skipping a beat. This is usually not a serious problem. In some cases, you may need tests to rule out any serious problems. Follow these instructions at home: Pay attention to any changes in your condition. Take these actions to help manage your  symptoms: Eating and drinking  Avoid: ? Coffee, tea, soft drinks, and energy drinks. ? Chocolate. ? Alcohol. ? Diet pills. Lifestyle   Try to lower your stress. These things can help you relax: ? Yoga. ? Deep breathing and meditation. ? Exercise. ? Using words and images to create positive thoughts (guided imagery). ? Using your mind to control things in your body (biofeedback).  Do not use drugs.  Get plenty of rest and sleep. Keep a regular bed time. General instructions   Take over-the-counter and prescription medicines only as told by your doctor.  Do not use any products that contain nicotine or tobacco, such as cigarettes and e-cigarettes. If you need help quitting, ask your doctor.  Keep all follow-up visits as told by your doctor. This is important. You may need more tests if palpitations do not go away or get worse. Contact a doctor if:  Your symptoms last more than 24 hours.  Your symptoms occur more often. Get help right away if you:  Have chest pain.  Feel short of breath.  Have a very bad headache.  Feel dizzy.  Pass out (faint). Summary  Palpitations are feelings that your heartbeat is uneven or faster than normal. It may feel like your heart is fluttering or skipping a beat.  Avoid food and drinks that may cause palpitations. These include caffeine, chocolate, and alcohol.  Try to lower your stress. Do not smoke or use drugs.  Get help right away if you faint or have chest pain, shortness of breath, a severe headache, or dizziness. This  information is not intended to replace advice given to you by your health care provider. Make sure you discuss any questions you have with your health care provider. Document Revised: 06/23/2017 Document Reviewed: 06/23/2017 Elsevier Patient Education  2020 [employer].

## 2020-04-12 NOTE — Progress Notes (Signed)
Office Visit    Patient Name: Rebecca Mueller Date of Encounter: 04/12/2020  Primary Care Provider:  Carlena Hurl, PA-C Primary Cardiologist:  Nelva Bush, MD Electrophysiologist:  None   Chief Complaint    Rebecca Mueller is a 44 y.o. female with a hx of unprovoked DVT, atrial flutter, HTN, DM2, migraine headaches, bipolar disorder presents today for follow-up of palpitations  Past Medical History    Past Medical History:  Diagnosis Date  . Acute deep vein thrombosis (DVT) of right peroneal vein ([place]) 08/11/2019  . Allergic rhinitis 06/23/2018  . Allergy   . Anxiety   . Atypical chest pain 08/20/2019  . Bipolar disorder [facility])    hospitalized 2010, 2013  . Chronic wrist pain, right 06/23/2018  . Depressed mood 06/23/2018  . Diabetes mellitus without complication ([place]) 4166   type 2  . Dizziness 08/17/2019  . Essential hypertension 06/23/2018  . Family history of clotting disorder 08/11/2019  . Gastroesophageal reflux disease without esophagitis 06/23/2018  . GERD (gastroesophageal reflux disease)   . H/O tubal ligation 08/17/2019  . History of atrial flutter 08/11/2019  . History of cardiovascular stress test 2019   after chest pain, reportedly normal per patient 05/2018  . History of cold sores   . History of miscarriage 08/11/2019  . Hypertension   . Joint pain    right wrist, chronic pain  . Leg pain, diffuse, right 08/11/2019  . Migraine without status migrainosus, not intractable 06/23/2018  . Migraines    since childhood  . Palpitations 08/20/2019  . Preeclampsia 2013   with acute kidney failure, acute tubular necrosis, dialysis 1.5 mo, was in ICU  . Pyelonephritis 2013  . Screen for STD (sexually transmitted disease) 06/23/2018  . Screening for cervical cancer 06/23/2018  . Sleep-disordered breathing 10/21/2019  . Vitamin D deficiency   . Wears glasses    Past Surgical History:  Procedure Laterality Date  . CARPAL TUNNEL RELEASE  2014  . CESAREAN SECTION       x4  . [place] RELEASE  2018  . 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 ORIF WRIST FRACTURE  2014   right  . TUBAL LIGATION    . WRIST SURGERY  2018   hardware removal     Allergies  Allergies  Allergen Reactions  . Metoclopramide Other (See Comments)  . Nsaids     Avoids due to hx/o sleeve gastric bypass  . Penicillins Rash  . Promethazine Other (See Comments)    Shaky legs, restless legs    History of Present Illness    Rebecca Mueller is a 44 y.o. female with a hx of unprovoked DVT, atrial flutter, HTN, DM2, migraine headaches, bipolar disorder last seen 10/12/2019 by Dr. Saunders Revel.  Seen in the ED 07/14/2019 and diagnosed with acute right peroneal vein DVT.  Subsequent echocardiogram 09/28/2019 showed NSR 60-65 bpm, no wall motion abnormalities, RV normal size and function, no significant valvular abnormalities.  Nuclear stress test 10/16/2019 was low risk with no evidence of ischemia.  She was seen in early May 2021 noting palpitations and episodes of presyncope as well as intermittent chest pain.  She wore a 14-day event monitor which showed predominantly normal sinus rhythm with average heart rate of 87 bpm, rare PAC/PVC.  Her triggered episodes correlated to NSR and NSR with PVC.  She trialed metoprolol though noted resting bradycardia and as such it was discontinued.  Since last seen she has had sleep study which showed no  evidence of obstructive sleep apnea nausea central sleep apnea and oxygen desaturation.    She presents today for follow-up.  Tells me she has been doing well.  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  Feels she is sleeping much better after starting the new medications.  She reports no chest pain, pressure, tightness.  Reports no shortness of breath at rest.  Tells me her dyspnea on exertion is stable at baseline and she attributes this to her inactivity.   She has been trying to be more active and notices that her weight is going down.  She stays very busy helping get her kids to and from multiple activities.  She reports no palpitations, near syncope, nor syncope  EKGs/Labs/Other Studies Reviewed:   The following studies were reviewed today:   Long term monitor (11/06/19): Predominant rhythm NSR average rate of 87 bpm (range 46-161 bpm).  Rare PAC/PVC.  No sustained arrhythmia or prolonged pause.  Patient triggered events corresponded to NSR in NSR with PVCs.  Pharmacologic MPI (10/16/2019): Normal study without ischemia or scar.  Normal LVEF.  TTE (09/28/2019): Normal LV size with LVEF 60-65% and normal diastolic function.  Normal RV size and function.  No significant valvular abnormality.  Normal CVP.  Right lower extremity venous duplex (08/10/2019): Occlusive thrombus in the peroneal vein.  TTE (05/07/2017, [place]): Normal LV size.  LVEF 00-76% normal diastolic function and regional wall motion.  Trace MR, TR.  Mild AI.    EKG:  EKG is  ordered today.  The ekg ordered today demonstrates SR 74 bpm with no acute ST/T wave changes.  Recent Labs: 08/10/2019: B Natriuretic Peptide 18.0 09/29/2019: ALT 12; BUN 11; Creatinine, Ser 1.08; Hemoglobin 11.3; Platelets 356; Potassium 4.5; Sodium 140  Recent Lipid Panel    Component Value Date/Time   CHOL 216 (H) 09/29/2019 1550   TRIG 90 09/29/2019 1550   HDL 91 09/29/2019 1550   CHOLHDL 2.4 09/29/2019 1550   LDLCALC 109 (H) 09/29/2019 1550   Home Medications   Current Meds  Medication Sig  . amLODipine (NORVASC) 5 MG tablet Take 1 tablet (5 mg total) by mouth daily.  . blood glucose meter kit and supplies Dispense based on patient and insurance preference. Use 1-2 times daily. Need test strips and lancets  . buPROPion (WELLBUTRIN XL) 150 MG 24 hr tablet Take 150 mg by mouth daily.  . Cholecalciferol (VITAMIN D3) 1.25 MG (50000 UT) CAPS TAKE 1 CAPSULE BY MOUTH ONE TIME PER WEEK  .  divalproex (DEPAKOTE) 250 MG DR tablet Take 750 mg by mouth at bedtime.   . ferrous gluconate (FERGON) 324 MG tablet TAKE 1 TABLET BY MOUTH DAILY WITH BREAKFAST  . glucose blood test strip Use as instructed  . hydrocortisone 2.5 % cream Apply topically 2 (two) times daily.  Elmore Gu[employer] Devices MISC 1 each by Does not apply route daily.  Marland Kitchen losartan (COZAAR) 25 MG tablet TAKE 1 TABLET BY MOUTH EVERY DAY  . Omeprazole 20 MG TBDD daily.   . rivaroxaban (XARELTO) 20 MG TABS tablet Take 1 tablet (20 mg total) by mouth daily with supper.  . rosuvastatin (CRESTOR) 10 MG tablet Take 1 tablet (10 mg total) by mouth daily.     Review of Systems  All other systems reviewed and are otherwise negative except as noted above.  Physical Exam    VS:  Ht 5' (  1.524 m)   Wt 195 lb 2 oz (88.5 kg)   BMI 38.11 kg/m  , BMI Body mass index is 38.11 kg/m.  Wt Readings from Last 3 Encounters:  04/12/20 195 lb 2 oz (88.5 kg)  12/14/19 197 lb 12.8 oz (89.7 kg)  11/21/19 195 lb (88.5 kg)   GEN: Well nourished, well developed, in no acute distress. HEENT: normal. Neck: Supple, no JVD, carotid bruits, or masses. Cardiac: RRR, no murmurs, rubs, or gallops. No clubbing, cyanosis, edema.  Radials/DP/PT 2+ and equal bilaterally.  Respiratory:  Respirations regular and unlabored, clear to auscultation bilaterally. GI: Soft, nontender, nondistended. MS: No deformity or atrophy. Skin: Warm and dry, no rash. Neuro:  Strength and sensation are intact. Psych: Normal affect.  Assessment & Plan    1. Palpitations / Atrial flutter- EKG today shows she is maintaining NSR.  No metoprolol or beta-blocker due to previous history of bradycardia.  We discussed triggers for palpitations including stress, dehydration.  She denies recurrent palpitations.  2. Unprovoked DVT / Chronic anticoagulation-no evidence of recurrent DVT.  Denies bleeding complications on Xarelto 20 mg daily.  3. Sleep disordered breathing-Notes occasional  snoring.  She had sleep study 01/2020 with no evidence of sleep apnea.  Tells me her sleeping has improved since changing medications with her psychiatrist.  4. HTN- BP well controlled.  Reports her blood pressures at home readings are well controlled as well.  Continue current antihypertensive regimen.   5. DM2-continue plan for primary care provider.  Disposition: Follow up in 1 year(s) with Dr. Saunders Revel or APP  Signed, Loel Dubonnet, NP 04/12/2020, 9:53 AM [place]

## 2020-04-12 NOTE — Telephone Encounter (Signed)
Called pt and she said her work scheduled has been tough lately but she will call in Jan to get scheduled

## 2020-05-12 ENCOUNTER — Other Ambulatory Visit: Payer: Self-pay | Admitting: Medical

## 2020-05-13 NOTE — Telephone Encounter (Signed)
Please advise on refill for iron. Pt last last were in June of this year. KH

## 2020-05-21 ENCOUNTER — Other Ambulatory Visit: Payer: Self-pay | Admitting: Medical

## 2020-06-09 ENCOUNTER — Other Ambulatory Visit: Payer: Self-pay | Admitting: Medical

## 2020-09-23 ENCOUNTER — Other Ambulatory Visit: Payer: Self-pay | Admitting: Medical

## 2020-10-14 IMAGING — MG DIGITAL SCREENING BILAT W/ TOMO W/ CAD
8 series · 8 of 24 positions shown · non-contrast
Comparison: None.

CLINICAL DATA: Screening.

EXAM:
DIGITAL SCREENING BILATERAL MAMMOGRAM WITH TOMO AND CAD

[R MLO synth-2D]
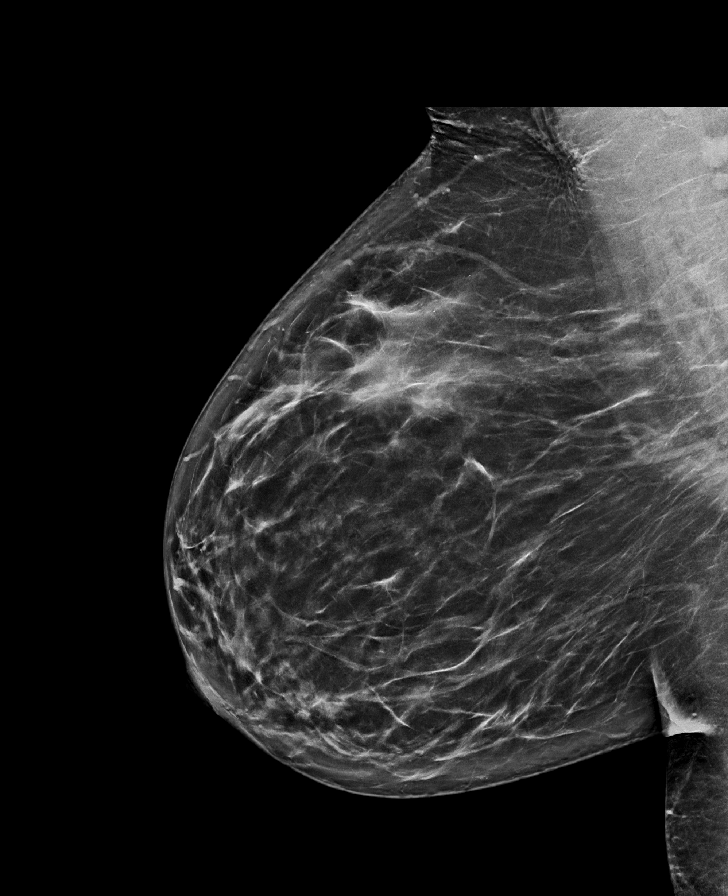

[L CC synth-2D]
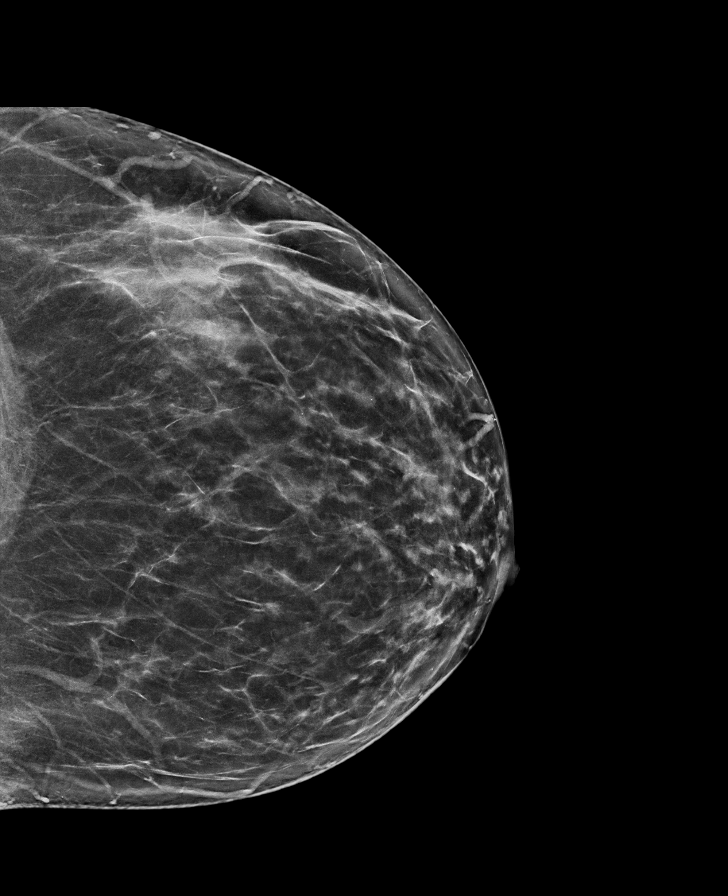

[R CC synth-2D]
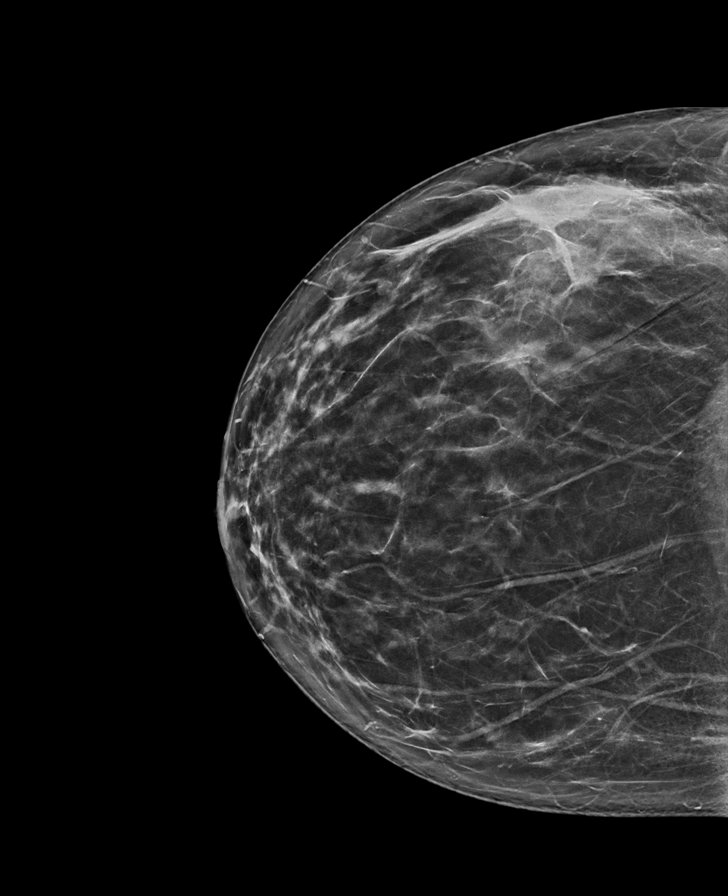

[L MLO synth-2D]
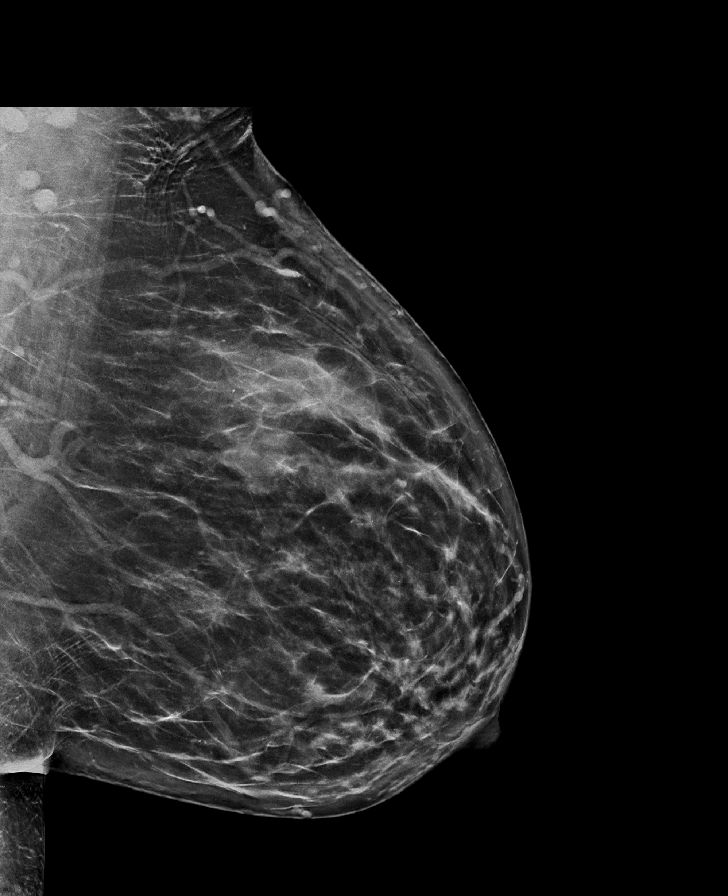

[R CC tomo · tomo slice 37/73.0]
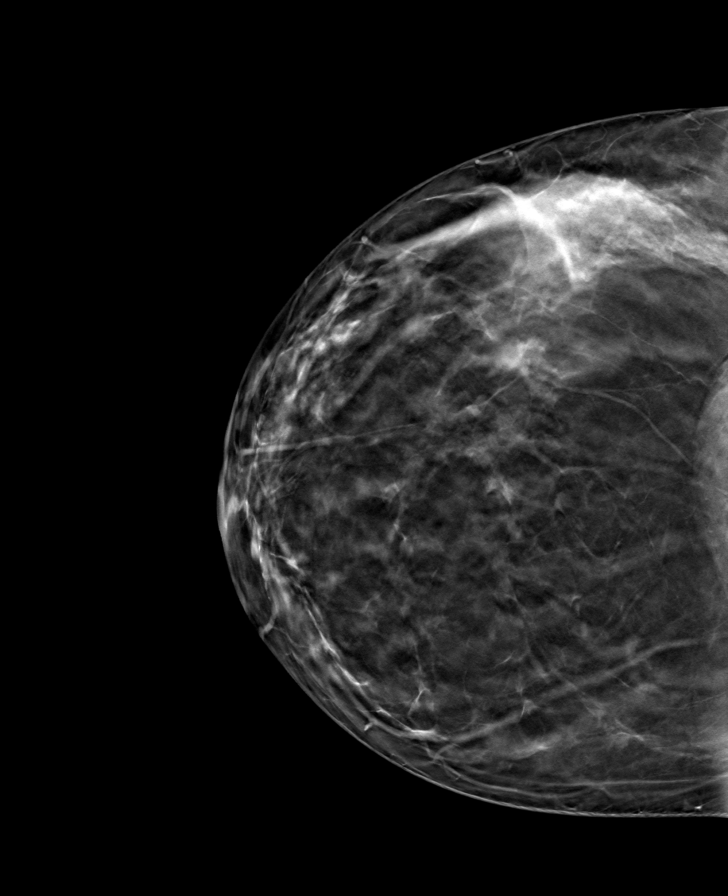

[L MLO tomo · tomo slice 41/82.0]
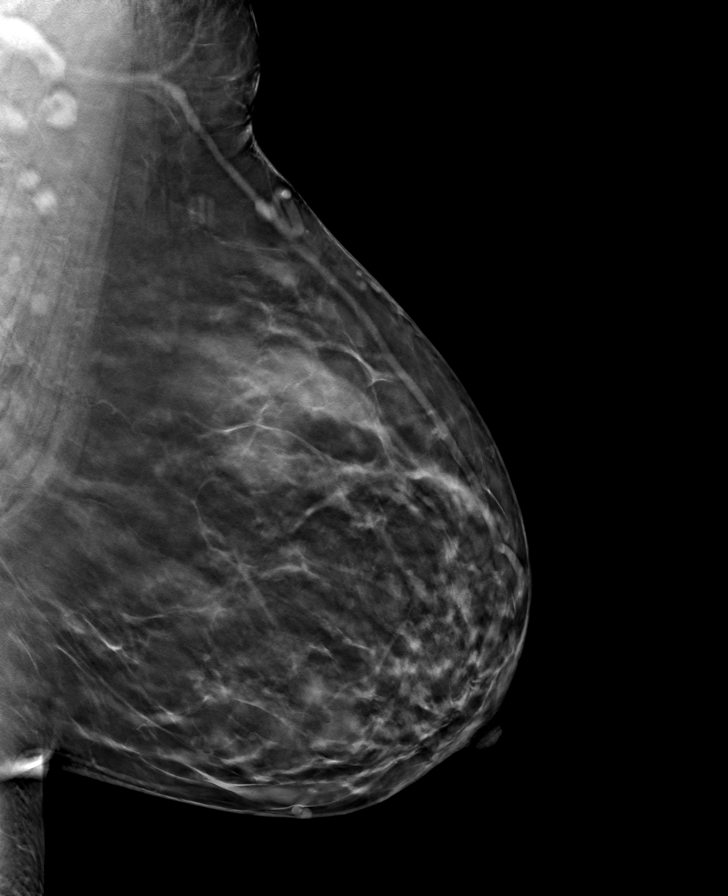

[L CC tomo · tomo slice 36/71.0]
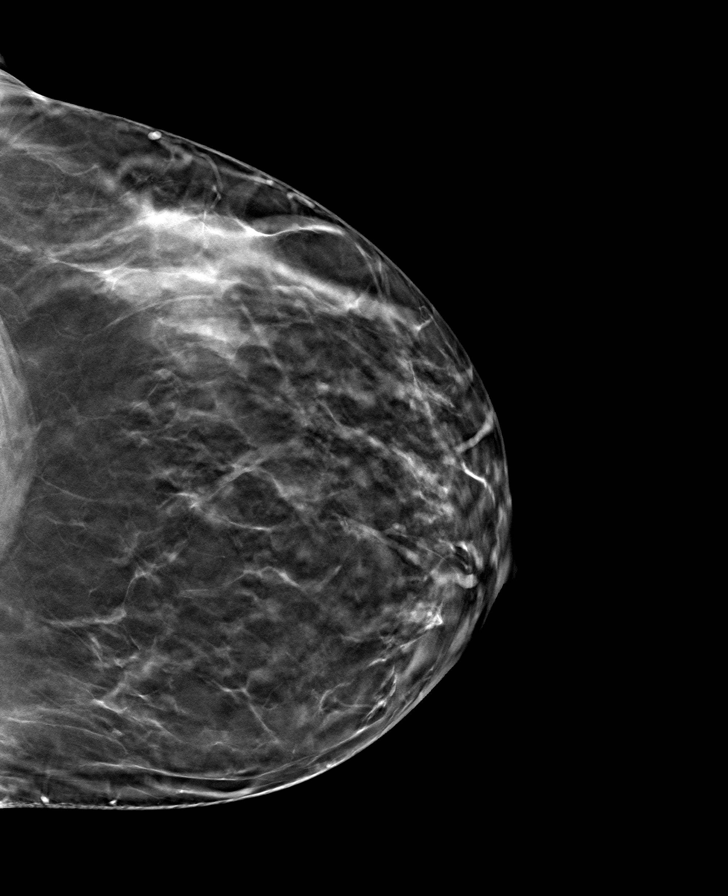

[R MLO tomo · tomo slice 43/85.0]
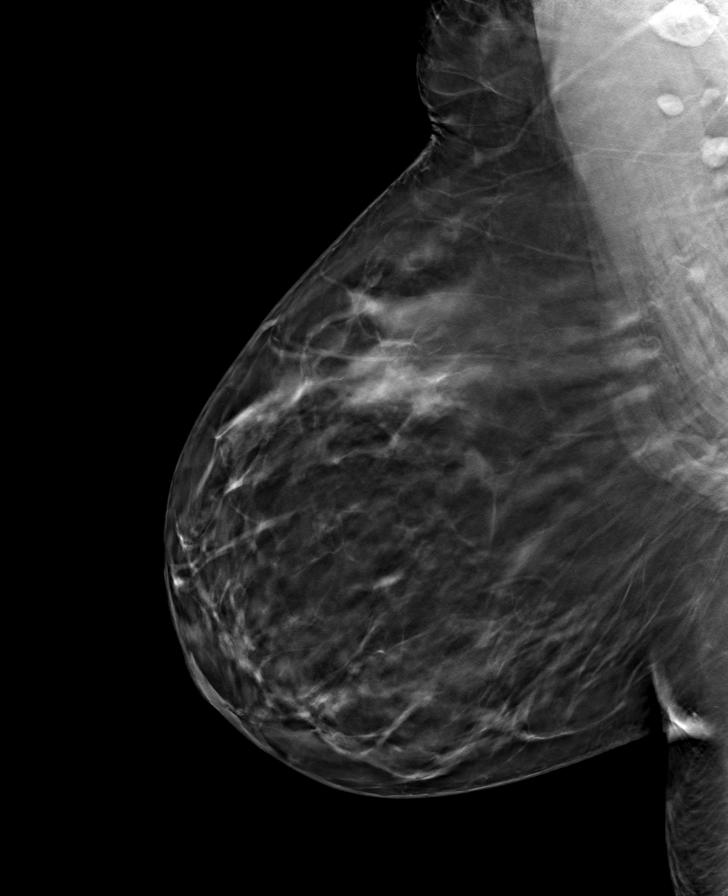

[8 of 24 positions shown; findings below may reference images not displayed]

ACR Breast Density Category b: There are scattered areas of
fibroglandular density.
FINDINGS: There are no findings suspicious for malignancy. Images were
processed with CAD.
IMPRESSION: No mammographic evidence of malignancy. A result letter of this
screening mammogram will be mailed directly to the patient.

RECOMMENDATION:
Screening mammogram in one year. (Code:Y5-G-EJ6)

BI-RADS CATEGORY  1: Negative.

## 2020-10-29 ENCOUNTER — Other Ambulatory Visit: Payer: Self-pay | Admitting: Medical

## 2020-10-29 MED ORDER — RIVAROXABAN 20 MG PO TABS: 20.0000 mg | ORAL_TABLET | Freq: Every day | ORAL | 0 refills | Status: DC

## 2020-10-30 ENCOUNTER — Other Ambulatory Visit: Payer: Self-pay

## 2020-10-30 ENCOUNTER — Telehealth: Payer: Self-pay | Admitting: Medical

## 2020-10-30 ENCOUNTER — Encounter: Payer: Self-pay | Admitting: Medical

## 2020-10-30 ENCOUNTER — Telehealth (INDEPENDENT_AMBULATORY_CARE_PROVIDER_SITE_OTHER): Payer: BC Managed Care – PPO | Admitting: Medical

## 2020-10-30 VITALS — Ht 60.0 in | Wt 204.0 lb

## 2020-10-30 DIAGNOSIS — M79604 Pain in right leg: Secondary | ICD-10-CM

## 2020-10-30 DIAGNOSIS — Z86718 Personal history of other venous thrombosis and embolism: Secondary | ICD-10-CM | POA: Diagnosis not present

## 2020-10-30 DIAGNOSIS — Z79899 Other long term (current) drug therapy: Secondary | ICD-10-CM | POA: Insufficient documentation

## 2020-10-30 DIAGNOSIS — Z8759 Personal history of other complications of pregnancy, childbirth and the puerperium: Secondary | ICD-10-CM

## 2020-10-30 DIAGNOSIS — E119 Type 2 diabetes mellitus without complications: Secondary | ICD-10-CM | POA: Diagnosis not present

## 2020-10-30 DIAGNOSIS — Z862 Personal history of diseases of the blood and blood-forming organs and certain disorders involving the immune mechanism: Secondary | ICD-10-CM | POA: Insufficient documentation

## 2020-10-30 DIAGNOSIS — Z832 Family history of diseases of the blood and blood-forming organs and certain disorders involving the immune mechanism: Secondary | ICD-10-CM

## 2020-10-30 DIAGNOSIS — I10 Essential (primary) hypertension: Secondary | ICD-10-CM

## 2020-10-30 DIAGNOSIS — F319 Bipolar disorder, unspecified: Secondary | ICD-10-CM

## 2020-10-30 DIAGNOSIS — K219 Gastro-esophageal reflux disease without esophagitis: Secondary | ICD-10-CM

## 2020-10-30 DIAGNOSIS — M79605 Pain in left leg: Secondary | ICD-10-CM | POA: Insufficient documentation

## 2020-10-30 DIAGNOSIS — Z903 Acquired absence of stomach [part of]: Secondary | ICD-10-CM

## 2020-10-30 DIAGNOSIS — E559 Vitamin D deficiency, unspecified: Secondary | ICD-10-CM

## 2020-10-30 DIAGNOSIS — F419 Anxiety disorder, unspecified: Secondary | ICD-10-CM

## 2020-10-30 MED ORDER — AMLODIPINE BESYLATE 5 MG PO TABS: 5.0000 mg | ORAL_TABLET | Freq: Every day | ORAL | 0 refills | Status: AC

## 2020-10-30 NOTE — Progress Notes (Signed)
Subjective:     Patient ID: Rebecca Mueller, female   DOB: 12-07-1975, 45 y.o.   MRN: [id number]  This visit type was conducted due to national recommendations for restrictions regarding the COVID-19 Pandemic (e.g. social distancing) in an effort to limit this patient's exposure and mitigate transmission in our community.  Due to their co-morbid illnesses, this patient is at least at moderate risk for complications without adequate follow up.  This format is felt to be most appropriate for this patient at this time.    Documentation for virtual audio and video telecommunications through [place] encounter:  The patient was located at home. The provider was located in the office. The patient did consent to this visit and is aware of possible charges through their insurance for this visit.  The other persons participating in this telemedicine service were none. Time spent on call was 20 minutes and in review of previous records 20 minutes total.  This virtual service is not related to other E/M service within previous 7 days.   HPI Chief Complaint  Patient presents with  . Medication Management    Refills on Xarelto    Medical Team: Dr. Harrell Gave End, cardiology [facility] [address], Dr. Truett Perna, psychiatry Eye doctor, Dr. Edwina Barth Dentist Aubert Choyce, Camelia Eng, PA-C   Virtual consult today.  History of DVT - recent ran out of Xarelto a few days ago, and we called out 30 day supply last night which she picked up from pharmacy and started back.  No prior hematology consult.  Wears compression hose.  Walking for exercise.  Works from home.  Does feel tired some, attributes to working more hours.   3-4 weeks ago, right lower leg started bothering her again like it did when first diagnosed with DVT.  Within last few months been working more overtime, has sedentary job.  Was 8hr/day, and went up to 12-16 hours daily.   Has since cut back on work hours.    Hypertension - is  taking Amlodipine 44m daily, Losartan 280mdaily.  She does check BPs.   Recently 113/73 113/88.    Hyperlipidemia - compliant with crestor 1071maily.   Hx/o anemia - ran out of iron 3 months ago.  Has some OTC iron but hasn't started it.    Not currently taking Depakote or Wellbutrin, ran out and hasn't followed up with psychiatrist lately.  Is due for appt  Vit D deficiency - is taking vit D weekly  Diabetic - not consistently checking this.   When she does is usually low 100s.  No foot concerns, no sores, but small toenail on right came off about a week ago.  Was painless.  She had sleep study last year.  Doesn't recall abnormal findings    Past Medical History:  Diagnosis Date  . Acute deep vein thrombosis (DVT) of right peroneal vein ([facility]19/20[place]  . Allergic rhinitis 06/23/2018  . Allergy   . Anxiety   . Atypical chest pain 08/20/2019  . [facility] ([facility]  [facility]  . Chronic wrist pain, right 06/23/2018  . Depressed mood 06/23/2018  . Diabetes mellitus without complication ([id number] 2  . Dizziness 08/17/2019  . Essential hypertension 06/23/2018  . Family history of clotting disorder 08/11/2019  . Gastroesophageal reflux disease without esophagitis 06/23/2018  . GERD (gastroesophageal reflux disease)   . H/O tubal ligation 08/17/2019  . History of atrial flutter 08/11/2019  . History of cardiovascular stress  test 2019   after chest pain, reportedly normal per patient 05/2018  . History of cold sores   . History of miscarriage 08/11/2019  . Hypertension   . Joint pain    right wrist, chronic pain  . Leg pain, diffuse, right 08/11/2019  . Migraine without status migrainosus, not intractable 06/23/2018  . Migraines    since childhood  . Palpitations 08/20/2019  . Preeclampsia 2013   with acute kidney failure, acute tubular necrosis, dialysis 1.5 mo, was in ICU  . Pyelonephritis 2013  . Screen for STD (sexually transmitted disease) 06/23/2018  .  Screening for cervical cancer 06/23/2018  . Sleep-disordered breathing 10/21/2019  . Vitamin D deficiency   . Wears glasses    Current Outpatient Medications on File Prior to Visit  Medication Sig Dispense Refill  . blood glucose meter kit and supplies Dispense based on patient and insurance preference. Use 1-2 times daily. Need test strips and lancets 1 each 0  . Cholecalciferol (VITAMIN D3) 1.25 MG (50000 UT) CAPS TAKE 1 CAPSULE BY MOUTH ONE TIME PER WEEK 12 capsule 3  . glucose blood test strip Use as instructed 100 each 12  . Lancet Devices MISC 1 each by Does not apply route daily. 100 each 11  . losartan (COZAAR) 25 MG tablet TAKE 1 TABLET BY MOUTH EVERY DAY 90 tablet 0  . Omeprazole 20 MG TBDD daily.     . rosuvastatin (CRESTOR) 10 MG tablet Take 1 tablet (10 mg total) by mouth daily. 90 tablet 3  . XARELTO 20 MG TABS tablet TAKE 1 TABLET (20 MG TOTAL) BY MOUTH DAILY WITH SUPPER. 90 tablet 0  . buPROPion (WELLBUTRIN XL) 150 MG 24 hr tablet Take 150 mg by mouth daily. (Patient not taking: Reported on 10/30/2020)    . divalproex (DEPAKOTE) 250 MG DR tablet Take 750 mg by mouth at bedtime.  (Patient not taking: Reported on 10/30/2020)    . hydrocortisone 2.5 % cream Apply topically 2 (two) times daily. (Patient not taking: Reported on 10/30/2020) 30 g 1   No current facility-administered medications on file prior to visit.    Review of Systems As in subjective    Objective:   Physical Exam Due to coronavirus pandemic stay at home measures, patient visit was virtual and they were not examined in person.   Ht 5' (1.524 m)   Wt 204 lb (92.5 kg)   BMI 39.84 kg/m   Wt Readings from Last 3 Encounters:  10/30/20 204 lb (92.5 kg)  04/12/20 195 lb 2 oz (88.5 kg)  12/14/19 197 lb 12.8 oz (89.7 kg)   BP Readings from Last 3 Encounters:  04/12/20 128/80  12/14/19 (!) 136/82  10/20/19 126/80    Gen: wd, wn, nad otherwise not examined in this virtual consult      Assessment:      Encounter Diagnoses  Name Primary?  . Essential hypertension Yes  . History of DVT (deep vein thrombosis)   . Type 2 diabetes mellitus without complication, without long-term current use of insulin ([place])   . Bipolar affective disorder, remission status unspecified ([place])   . Anxiety   . Family history of clotting disorder   . Gastroesophageal reflux disease without esophagitis   . History of miscarriage   . History of sleeve gastrectomy   . Vitamin D deficiency   . High risk medication use   . Pain in both lower extremities   . History of anemia  Plan:     Hypertension-Home blood pressure readings at goal.  Continue amlodipine 5 mg daily and losartan 25 mg daily  History of DVT-currently on Xarelto.  She has been on Xarelto since March 2021.  She has a history of miscarriage.  Unprovoked DVT March 2021.  Family history of clotting disorder.  Consider referral to hematology for consult.   Diabetes-noncompliant with follow-up since last visit well over 6 months ago.  Due for labs.  Home blood sugar readings have been okay but advise she check more regularly.  Currently not on medication for diabetes as she has been diet controlled  GERD-no recent problems, on medication  Hyperlipidemia- continue rosuvastatin Crestor 10 mg daily  Vitamin D deficiency-continue weekly 50,000 units.  She is due for labs at this time  History of iron deficiency anemia.  Currently not taking iron for the last 3 months.  She is due for recheck on labs  Anxiety, history of bipolar-advise she follow-up with psychiatry.  Not currently on her Wellbutrin or Depakote    Luke was seen today for medication management.  Diagnoses and all orders for this visit:  Essential hypertension  History of DVT (deep vein thrombosis) -     Ambulatory referral to Hematology / Oncology  Type 2 diabetes mellitus without complication, without long-term current use of insulin (HCC)  Bipolar affective  disorder, remission status unspecified ([place])  Anxiety  Family history of clotting disorder -     Ambulatory referral to Hematology / Oncology  Gastroesophageal reflux disease without esophagitis  History of miscarriage -     Ambulatory referral to Hematology / Oncology  History of sleeve gastrectomy  Vitamin D deficiency  High risk medication use  Pain in both lower extremities  History of anemia  Other orders -     amLODipine (NORVASC) 5 MG tablet; Take 1 tablet (5 mg total) by mouth daily.  f/u pending call back

## 2020-10-30 NOTE — Telephone Encounter (Signed)
Please call patient  I reviewed back over her records yesterday.   I refilled one of her medication that was expired.   All other mediations have enough to last her until physical in July.  If she feels this is not the case, then call pharmacy to make sure or verify what pharmacy is showing needing refills .  I want to refer her to hematology for consult on whether she needs to remain on Xarelto indefinitely.  For now she should continue this medication.  I recommend she get up and move every hour during her work day where she has a sedentary job.  If the leg symptom persist or worse in the next several days, then come in for in person eval  Vincenza Hews

## 2020-10-31 ENCOUNTER — Telehealth: Payer: Self-pay | Admitting: Hematology and Oncology

## 2020-10-31 NOTE — Telephone Encounter (Signed)
Scheduled appt per 6/8 referral. Pt aware.  

## 2020-11-01 ENCOUNTER — Other Ambulatory Visit: Payer: Self-pay

## 2020-11-01 DIAGNOSIS — E119 Type 2 diabetes mellitus without complications: Secondary | ICD-10-CM | POA: Insufficient documentation

## 2020-11-01 DIAGNOSIS — Z79899 Other long term (current) drug therapy: Secondary | ICD-10-CM | POA: Diagnosis not present

## 2020-11-01 DIAGNOSIS — I10 Essential (primary) hypertension: Secondary | ICD-10-CM | POA: Insufficient documentation

## 2020-11-01 DIAGNOSIS — Z86718 Personal history of other venous thrombosis and embolism: Secondary | ICD-10-CM | POA: Diagnosis not present

## 2020-11-01 DIAGNOSIS — K219 Gastro-esophageal reflux disease without esophagitis: Secondary | ICD-10-CM | POA: Insufficient documentation

## 2020-11-01 DIAGNOSIS — Z87891 Personal history of nicotine dependence: Secondary | ICD-10-CM | POA: Insufficient documentation

## 2020-11-01 DIAGNOSIS — M79604 Pain in right leg: Secondary | ICD-10-CM | POA: Diagnosis not present

## 2020-11-01 LAB — CBC
HCT: 32.4 % — ABNORMAL LOW (ref 36.0–46.0)
Hemoglobin: 10.4 g/dL — ABNORMAL LOW (ref 12.0–15.0)
MCH: 25.2 pg — ABNORMAL LOW (ref 26.0–34.0)
MCHC: 32.1 g/dL (ref 30.0–36.0)
MCV: 78.6 fL — ABNORMAL LOW (ref 80.0–100.0)
Platelets: 323 10*3/uL (ref 150–400)
RBC: 4.12 MIL/uL (ref 3.87–5.11)
RDW: 15.3 % (ref 11.5–15.5)
WBC: 8.5 10*3/uL (ref 4.0–10.5)
nRBC: 0 % (ref 0.0–0.2)

## 2020-11-01 LAB — COMPREHENSIVE METABOLIC PANEL
ALT: 13 U/L (ref 0–44)
AST: 20 U/L (ref 15–41)
Albumin: 4 g/dL (ref 3.5–5.0)
Alkaline Phosphatase: 57 U/L (ref 38–126)
Anion gap: 7 (ref 5–15)
BUN: 12 mg/dL (ref 6–20)
CO2: 24 mmol/L (ref 22–32)
Calcium: 9.3 mg/dL (ref 8.9–10.3)
Chloride: 106 mmol/L (ref 98–111)
Creatinine, Ser: 0.92 mg/dL (ref 0.44–1.00)
GFR, Estimated: >60 mL/min (ref >60)
Glucose, Bld: 161 mg/dL — ABNORMAL HIGH (ref 70–99)
Potassium: 4 mmol/L (ref 3.5–5.1)
Sodium: 137 mmol/L (ref 135–145)
Total Bilirubin: 0.5 mg/dL (ref 0.3–1.2)
Total Protein: 7.2 g/dL (ref 6.5–8.1)

## 2020-11-01 NOTE — ED Triage Notes (Signed)
PT STATES SHE IS OUT OF HER XARELTO FOR DVT AND IS ALSO HAVING UPPER ABD PAIN.

## 2020-11-02 ENCOUNTER — Emergency Department
Admission: EM | Admit: 2020-11-02 | Discharge: 2020-11-02 | Disposition: A | Discharge status: Home / Self Care | Payer: BC Managed Care – PPO | Attending: Emergency Medicine | Admitting: Emergency Medicine

## 2020-11-02 DIAGNOSIS — K219 Gastro-esophageal reflux disease without esophagitis: Secondary | ICD-10-CM

## 2020-11-02 DIAGNOSIS — M79604 Pain in right leg: Secondary | ICD-10-CM

## 2020-11-02 DIAGNOSIS — Z86718 Personal history of other venous thrombosis and embolism: Secondary | ICD-10-CM

## 2020-11-02 LAB — LIPASE, BLOOD: Lipase: 41 U/L (ref 11–51)

## 2020-11-02 MED ORDER — RIVAROXABAN 20 MG PO TABS: 20.0000 mg | ORAL_TABLET | Freq: Every day | ORAL | 2 refills | Status: DC

## 2020-11-02 NOTE — ED Provider Notes (Signed)
[facility] Emergency Department Provider Note  ____________________________________________   Event Date/Time   First MD Initiated Contact with Patient 11/02/20 [phone]     (approximate)  I have reviewed the triage vital signs and the nursing notes.   HISTORY  Chief Complaint Abdominal Pain    HPI Rebecca Mueller is a 45 y.o. female with medical history as listed below which notably includes a diagnosis of DVT in her right leg and a history of paroxysmal A. fib or a flutter.  She presents tonight because she has been off of her Xarelto for almost 2 weeks due to problems with her insurance.  She followed up with her primary care doctor but they were not able to work out [employer] issue and her pharmacy told her it was going to be about $700 for 30-day supply.  Her primary care provider told her to go to the emergency department if she has more problems.  She said that she has been having more pain in her right leg which was previously diagnosed with a DVT.  No obvious swelling.  Nothing particular makes it better or worse.  She has had no shortness of breath or chest pain.  She denies fever/chills, nausea, vomiting.  She also reports that she is having increasing problems with acid reflux.  When she lies down at night she wakes up choking with a sensation of something coming up in her throat and she frequently has burning in her upper abdomen.  She has had a gastric bypass in the past.  She does not vomit frequently and is not having abdominal pain other than the burning in the upper abdomen.  Symptoms can be severe at times.     Past Medical History:  Diagnosis Date   Acute deep vein thrombosis (DVT) of right peroneal vein (HCC) 08/11/2019   Allergic rhinitis 06/23/2018   Allergy    Anxiety    Atypical chest pain 08/20/2019   Bipolar disorder ([place])    hospitalized 2010, 2013   Chronic wrist pain, right 06/23/2018   Depressed mood 06/23/2018   Diabetes  mellitus without complication ([place]) 6578   type 2   Dizziness 08/17/2019   Essential hypertension 06/23/2018   Family history of clotting disorder 08/11/2019   Gastroesophageal reflux disease without esophagitis 06/23/2018   GERD (gastroesophageal reflux disease)    H/O tubal ligation 08/17/2019   History of atrial flutter 08/11/2019   History of cardiovascular stress test 2019   after chest pain, reportedly normal per patient 05/2018   History of cold sores    History of miscarriage 08/11/2019   Hypertension    Joint pain    right wrist, chronic pain   Leg pain, diffuse, right 08/11/2019   Migraine without status migrainosus, not intractable 06/23/2018   Migraines    since childhood   Palpitations 08/20/2019   Preeclampsia 2013   with acute kidney failure, acute tubular necrosis, dialysis 1.5 mo, was in ICU   Pyelonephritis 2013   Screen for STD (sexually transmitted disease) 06/23/2018   Screening for cervical cancer 06/23/2018   Sleep-disordered breathing 10/21/2019   Vitamin D deficiency    Wears glasses     Patient Active Problem List   Diagnosis Date Noted   History of DVT (deep vein thrombosis) 10/30/2020   High risk medication use 10/30/2020   Pain in both lower extremities 10/30/2020   History of anemia 10/30/2020   Encounter for health maintenance examination in adult 12/14/2019   Need  for pneumococcal vaccination 12/14/2019   History of sleeve gastrectomy 12/14/2019   Adnexal tenderness, left 12/14/2019   Peroneal DVT (deep venous thrombosis) ([place]) 11/21/2019   Snoring 11/21/2019   Sleep-disordered breathing 10/21/2019   Palpitations 08/20/2019   Atypical chest pain 08/20/2019   H/O tubal ligation 08/17/2019   Family history of clotting disorder 08/11/2019   History of miscarriage 08/11/2019   History of atrial flutter 08/11/2019   Essential hypertension 06/23/2018   Allergic rhinitis 06/23/2018   Anxiety 06/23/2018   Depressed mood 06/23/2018   Bipolar disorder  ([place]) 06/23/2018   Gastroesophageal reflux disease without esophagitis 06/23/2018   Migraine without status migrainosus, not intractable 06/23/2018   Chronic wrist pain, right 06/23/2018   Vitamin D deficiency 06/23/2018   Type 2 diabetes mellitus without complication, without long-term current use of insulin ([place]) 06/23/2018   Vaccine counseling 06/23/2018   Encounter for screening mammogram for malignant neoplasm of breast 06/23/2018   Screening for cervical cancer 06/23/2018    Past Surgical History:  Procedure Laterality Date   CARPAL TUNNEL RELEASE  2014   CESAREAN SECTION     x4   DE QUERVAIN'S RELEASE  2018   GASTRIC BYPASS  05/2016   sleeve   ORIF WRIST FRACTURE  2014   right   TUBAL LIGATION     WRIST SURGERY  2018   hardware removal     Prior to Admission medications   Medication Sig Start Date End Date Taking? Authorizing Provider  rivaroxaban (XARELTO) 20 MG TABS tablet Take 1 tablet (20 mg total) by mouth daily with supper. 11/02/20  Yes Hinda Kehr, MD  amLODipine (NORVASC) 5 MG tablet Take 1 tablet (5 mg total) by mouth daily. 10/30/20 01/28/21  Tysinger, Camelia Eng, PA-C  blood glucose meter kit and supplies Dispense based on patient and insurance preference. Use 1-2 times daily. Need test strips and lancets 01/04/20   Tysinger, Camelia Eng, PA-C  buPROPion (WELLBUTRIN XL) 150 MG 24 hr tablet Take 150 mg by mouth daily. Patient not taking: Reported on 10/30/2020 05/03/19   [provider]  Cholecalciferol (VITAMIN D3) 1.25 MG (50000 UT) CAPS TAKE 1 CAPSULE BY MOUTH ONE TIME PER WEEK 12/18/19   Tysinger, Camelia Eng, PA-C  divalproex (DEPAKOTE) 250 MG DR tablet Take 750 mg by mouth at bedtime.  Patient not taking: Reported on 10/30/2020 03/20/20   [provider]  glucose blood test strip Use as instructed 11/21/19   Tysinger, Camelia Eng, PA-C  hydrocortisone 2.5 % cream Apply topically 2 (two) times daily. Patient not taking: Reported on 10/30/2020 12/14/19   Carlena Hurl, PA-C  Lancet Devices MISC 1 each by Does not apply route daily. 11/21/19   Tysinger, Camelia Eng, PA-C  losartan (COZAAR) 25 MG tablet TAKE 1 TABLET BY MOUTH EVERY DAY 09/23/20   Tysinger, Camelia Eng, PA-C  Omeprazole 20 MG TBDD daily.  07/24/19   [provider]  rosuvastatin (CRESTOR) 10 MG tablet Take 1 tablet (10 mg total) by mouth daily. 12/18/19 12/17/20  Tysinger, Camelia Eng, PA-C    Allergies Metoclopramide, Nsaids, Penicillins, and Promethazine  Family History  Problem Relation Age of Onset   Stroke Mother    Diabetes Mother    Multiple sclerosis Mother    Hypertension Mother    Diabetes Father    Hypertension Father    Cancer Father        liver   Diabetes Sister    Hypertension Sister    Bipolar disorder Sister  Heart disease Maternal Grandmother    Diabetes Maternal Grandmother    Kidney disease Maternal Grandmother    Hypertension Maternal Grandmother    Heart disease Maternal Grandfather    Hypertension Maternal Grandfather    Cancer Maternal Grandfather        prostate   Diabetes Sister    Hypertension Sister    Bipolar disorder Sister    Heart disease Paternal Aunt        MI, CHF   Heart disease Paternal Uncle    Heart disease Paternal Grandmother    Heart disease Paternal Grandfather    Heart disease Paternal Aunt     Social History Social History   Tobacco Use   Smoking status: Former    Packs/day: 0.50    Years: 10.00    Pack years: 5.00    Types: Cigarettes    Quit date: 06/23/2016    Years since quitting: 4.3   Smokeless tobacco: Never  Vaping Use   Vaping Use: Never used  Substance Use Topics   Alcohol use: Not Currently    Comment: occasional   Drug use: Not Currently    Comment: prior marijuana    Review of Systems Constitutional: No fever/chills Eyes: No visual changes. ENT: No sore throat. Cardiovascular: Denies chest pain. Respiratory: Denies shortness of breath. Gastrointestinal: Upper abdominal burning pain with  regurgitation at night. Genitourinary: Negative for dysuria. Musculoskeletal: Positive for right leg pain without swelling. Integumentary: Negative for rash. Neurological: Negative for headaches, focal weakness or numbness.   ____________________________________________   PHYSICAL EXAM:  VITAL SIGNS: ED Triage Vitals [11/01/20 2014]  Enc Vitals Group     BP (!) 150/88     Pulse Rate 76     Resp 16     Temp 98.7 F (37.1 C)     Temp Source Oral     SpO2 100 %     Weight 93 kg (205 lb)     Height 1.524 m (5')     Head Circumference      Peak Flow      Pain Score 5     Pain Loc      Pain Edu?      Excl. in [place]?     Constitutional: Alert and oriented.  Eyes: Conjunctivae are normal.  Head: Atraumatic. Nose: No congestion/rhinnorhea. Mouth/Throat: Patient is wearing a mask. Neck: No stridor.  No meningeal signs.   Cardiovascular: Normal rate, regular rhythm. Good peripheral circulation. Respiratory: Normal respiratory effort.  No retractions. Gastrointestinal: Soft and nontender including in the epigastrium and right upper quadrant. Musculoskeletal: No lower extremity tenderness nor edema. No gross deformities of extremities. Neurologic:  Normal speech and language. No gross focal neurologic deficits are appreciated.  Skin:  Skin is warm, dry and intact. Psychiatric: Mood and affect are normal. Speech and behavior are normal.  ____________________________________________   LABS (all labs ordered are listed, but only abnormal results are displayed)  Labs Reviewed  COMPREHENSIVE METABOLIC PANEL - Abnormal; Notable for the following components:      Result Value   Glucose, Bld 161 (*)    All other components within normal limits  CBC - Abnormal; Notable for the following components:   Hemoglobin 10.4 (*)    HCT 32.4 (*)    MCV 78.6 (*)    MCH 25.2 (*)    All other components within normal limits  LIPASE, BLOOD  POC URINE PREG, ED    ____________________________________________  EKG  ED ECG REPORT I, Tommi Rumps  Karma Greaser, the attending physician, personally viewed and interpreted this ECG.  Date: 11/01/2020 EKG Time: 20: 51 Rate: 64 Rhythm: normal sinus rhythm with sinus arrhythmia QRS Axis: normal Intervals: normal ST/T Wave abnormalities: normal Narrative Interpretation: no evidence of acute ischemia  ____________________________________________    INITIAL IMPRESSION / MDM / ASSESSMENT AND PLAN / ED COURSE  As part of my medical decision making, I reviewed the following data within the electronic MEDICAL RECORD NUMBER History obtained from family, Nursing notes reviewed and incorporated, Labs reviewed , EKG interpreted , Old chart reviewed, Notes from prior ED visits, and [place] Controlled Substance Database   Differential diagnosis includes, but is not limited to, musculoskeletal pain, DVT, medication noncompliance, acid reflux, biliary colic.  The patient's abdominal symptoms are almost certainly the result of acid reflux.  We talked about it and she will continue her PPI, try to not eat before bed, lying on pillows to elevate her head, and follow-up with her PCP.  Her CMP is normal with no LFT elevation and she has no tenderness to palpation of the abdomen.  Lipase is normal, CBC is normal.  Regarding the issue with nonadherence to her medication regimen for anticoagulation.  She has a reassuring physical exam with no evidence of swelling, no palpable abnormalities in the popliteal fossa, and I have a low suspicion for acute DVT.  However, I offered an ultrasound but explained that it would not change the management; if she does not have a DVT, I still feel that she needs to be on Xarelto or another anticoagulant, and if she does have a DVT, the treatment is for her to be back on anticoagulation.  This is not a medication failure since she has not been on the medicine for about 2 weeks.  She agrees that she would prefer not  to pay for a study if it is not going to be helpful or change management.  She has no signs or symptoms of PE.  I found the help packet from [employer] that should help her with the cost of the Xarelto on a monthly basis I encouraged her to follow-up with her PCP.  I also wrote her a new prescription just in case the pharmacy needs a new one to go with the discount program.  She will follow-up with her PCP and she already has an appointment scheduled for hematology as well.  I gave my usual and customary return precautions.  ____________________________________________  FINAL CLINICAL IMPRESSION(S) / ED DIAGNOSES  Final diagnoses:  Right leg pain  History of DVT of lower extremity  Gastroesophageal reflux disease, unspecified whether esophagitis present     MEDICATIONS GIVEN DURING THIS VISIT:  Medications - No data to display   ED Discharge Orders          Ordered    rivaroxaban (XARELTO) 20 MG TABS tablet  Daily with supper        11/02/20 0453             Note:  This document was prepared using Dragon voice recognition software and may include unintentional dictation errors.   Hinda Kehr, MD 11/02/20 [phone]

## 2020-11-02 NOTE — Discharge Instructions (Addendum)
Your evaluation was generally reassuring today.  We discussed obtaining a new ultrasound, but given that you have a history of a blood clot in your right leg and that the treatment of the blood clot is to be on Xarelto, it makes more sense to get you back on the medication rather you pain for a steady that you do not really need.  Your abdominal discomfort is almost certainly due to acid reflux and I encourage you to read through the included information, try elevating your self on pillows at night, do not eat for several hours before bed, etc.  Try using the medication assistance program for which I provided you information.  I encourage you to follow-up with hematology and with your primary care provider for additional assistance.  Return to the emergency department if you develop new or worsening symptoms that concern you.

## 2020-11-11 ENCOUNTER — Encounter: Payer: Self-pay | Admitting: Hematology and Oncology

## 2020-11-11 ENCOUNTER — Inpatient Hospital Stay: Payer: BC Managed Care – PPO

## 2020-11-11 ENCOUNTER — Other Ambulatory Visit: Payer: Self-pay | Admitting: *Deleted

## 2020-11-11 ENCOUNTER — Inpatient Hospital Stay: Payer: BC Managed Care – PPO | Attending: Hematology and Oncology | Admitting: Hematology and Oncology

## 2020-11-11 ENCOUNTER — Other Ambulatory Visit: Payer: Self-pay

## 2020-11-11 VITALS — BP 119/57 | HR 62 | Temp 97.9°F | Resp 20 | Wt 205.8 lb

## 2020-11-11 DIAGNOSIS — Z79899 Other long term (current) drug therapy: Secondary | ICD-10-CM | POA: Diagnosis not present

## 2020-11-11 DIAGNOSIS — K649 Unspecified hemorrhoids: Secondary | ICD-10-CM | POA: Diagnosis not present

## 2020-11-11 DIAGNOSIS — Z87891 Personal history of nicotine dependence: Secondary | ICD-10-CM | POA: Diagnosis not present

## 2020-11-11 DIAGNOSIS — I82551 Chronic embolism and thrombosis of right peroneal vein: Secondary | ICD-10-CM

## 2020-11-11 DIAGNOSIS — Z86718 Personal history of other venous thrombosis and embolism: Secondary | ICD-10-CM | POA: Insufficient documentation

## 2020-11-11 DIAGNOSIS — N96 Recurrent pregnancy loss: Secondary | ICD-10-CM | POA: Insufficient documentation

## 2020-11-11 DIAGNOSIS — E119 Type 2 diabetes mellitus without complications: Secondary | ICD-10-CM | POA: Diagnosis not present

## 2020-11-11 DIAGNOSIS — K219 Gastro-esophageal reflux disease without esophagitis: Secondary | ICD-10-CM | POA: Diagnosis not present

## 2020-11-11 DIAGNOSIS — G4730 Sleep apnea, unspecified: Secondary | ICD-10-CM | POA: Insufficient documentation

## 2020-11-11 DIAGNOSIS — Z7901 Long term (current) use of anticoagulants: Secondary | ICD-10-CM | POA: Diagnosis not present

## 2020-11-11 DIAGNOSIS — I10 Essential (primary) hypertension: Secondary | ICD-10-CM | POA: Insufficient documentation

## 2020-11-11 LAB — CMP (CANCER CENTER ONLY)
ALT: 14 U/L (ref 0–44)
AST: 16 U/L (ref 15–41)
Albumin: 3.9 g/dL (ref 3.5–5.0)
Alkaline Phosphatase: 55 U/L (ref 38–126)
Anion gap: 6 (ref 5–15)
BUN: 15 mg/dL (ref 6–20)
CO2: 25 mmol/L (ref 22–32)
Calcium: 8.9 mg/dL (ref 8.9–10.3)
Chloride: 109 mmol/L (ref 98–111)
Creatinine: 0.96 mg/dL (ref 0.44–1.00)
GFR, Estimated: >60 mL/min (ref >60)
Glucose, Bld: 123 mg/dL — ABNORMAL HIGH (ref 70–99)
Potassium: 4.3 mmol/L (ref 3.5–5.1)
Sodium: 140 mmol/L (ref 135–145)
Total Bilirubin: 0.5 mg/dL (ref 0.3–1.2)
Total Protein: 7 g/dL (ref 6.5–8.1)

## 2020-11-11 LAB — CBC WITH DIFFERENTIAL (CANCER CENTER ONLY)
Abs Immature Granulocytes: 0.01 10*3/uL (ref 0.00–0.07)
Basophils Absolute: 0.1 10*3/uL (ref 0.0–0.1)
Basophils Relative: 1 %
Eosinophils Absolute: 0.1 10*3/uL (ref 0.0–0.5)
Eosinophils Relative: 1 %
HCT: 31.6 % — ABNORMAL LOW (ref 36.0–46.0)
Hemoglobin: 10.2 g/dL — ABNORMAL LOW (ref 12.0–15.0)
Immature Granulocytes: 0 %
Lymphocytes Relative: 36 %
Lymphs Abs: 1.9 10*3/uL (ref 0.7–4.0)
MCH: 25.4 pg — ABNORMAL LOW (ref 26.0–34.0)
MCHC: 32.3 g/dL (ref 30.0–36.0)
MCV: 78.8 fL — ABNORMAL LOW (ref 80.0–100.0)
Monocytes Absolute: 0.4 10*3/uL (ref 0.1–1.0)
Monocytes Relative: 7 %
Neutro Abs: 3 10*3/uL (ref 1.7–7.7)
Neutrophils Relative %: 55 %
Platelet Count: 302 10*3/uL (ref 150–400)
RBC: 4.01 MIL/uL (ref 3.87–5.11)
RDW: 16.8 % — ABNORMAL HIGH (ref 11.5–15.5)
WBC Count: 5.4 10*3/uL (ref 4.0–10.5)
nRBC: 0 % (ref 0.0–0.2)

## 2020-11-11 NOTE — Progress Notes (Signed)
Rebecca Mueller [phone]   Fax:([phone]) [place] NOTE  Patient Care Team: Tysinger, Camelia Eng, PA-C as PCP - General (Family Medicine) End, Harrell Gave, MD as PCP - Cardiology (Cardiology) Sueanne Margarita, MD as PCP - Sleep Medicine (Cardiology)  Hematological/Oncological History # Right Lower Extremity DVT 08/10/2019: [place] RLE showed an occlusive thrombus in the peroneal vein 08/16/2020: hypercoagulation panel showed no elevations in anticardiolipin or beta 2 glycoprotein antibodies.  11/11/2020: establish care with Dr. Lorenso Courier   CHIEF COMPLAINTS/PURPOSE OF CONSULTATION:  "History of DVT/Miscarriages/Family History "  HISTORY OF PRESENTING ILLNESS:  Rebecca Mueller 45 y.o. female with medical history significant for DM Type II, HTN, GERD, and allergies who presents for evaluation of a right lower extremity DVT.  On review of the previous records Rebecca Mueller initially presented the emergency department on 08/10/2019 with right lower extremity pain.  An ultrasound performed of just the right leg at that time showed a thrombus in the peroneal vein.  She had a hypercoagulable work-up performed on 08/16/2020 which showed no elevations in anticardiolipin, beta-2 glycoprotein or other clear abnormalities.  She was discharged on Xarelto therapy which she has continued at this time.  She is currently taking 20 mg Xarelto p.o. daily.  Due to concern for recurrent symptoms in her lower extremities she was referred to hematology for further evaluation and management.  On exam today Rebecca Mueller is accompanied by her fianc.  She reports that she has been having issues with recurrent pain in her lower extremities.  She describes it as an achy pain which is more severe than during her presentation, but "different in character".  She went to the emergency room recently for this pain but unfortunately did not have a repeat ultrasound performed at that time.  In terms of her  initial clot in March 2021 the patient reports that she had not had any recent prolonged travel or immobility.  She was not a smoker at that time she had quit in 2018.  She was not taking oral contraceptive pills that she had undergone a tubal ligation.  She does endorse having "knots" on the front of her leg during this time.  She does endorse having a family history of clots with her sister and maternal grandmother both having blood clots.  She notes that autoimmune conditions run in her family with 1 daughter having type 1 diabetes and mother having myasthenia gravis.  Of note the patient has had 2 miscarriages the first 1 being approximately 6 to 8 weeks of pregnancy when she "passed a big blood clot".  A another 1 occurred when she was taking birth control injections and unfortunately lost the pregnancy that occurred unexpectedly during that treatment.  She currently reports having some lower extremity discomfort and aches, but no frank swelling or erythema.  She is not having any chest pain or shortness of breath.  A full 10 point ROS is listed below.  MEDICAL HISTORY:  Past Medical History:  Diagnosis Date   Acute deep vein thrombosis (DVT) of right peroneal vein (HCC) 08/11/2019   Allergic rhinitis 06/23/2018   Allergy    Anxiety    Atypical chest pain 08/20/2019   Bipolar disorder ([place])    hospitalized 2010, 2013   Chronic wrist pain, right 06/23/2018   Depressed mood 06/23/2018   Diabetes mellitus without complication ([place]) 3825   type 2   Dizziness 08/17/2019   Essential hypertension 06/23/2018   Family history of clotting disorder 08/11/2019  Gastroesophageal reflux disease without esophagitis 06/23/2018   GERD (gastroesophageal reflux disease)    H/O tubal ligation 08/17/2019   History of atrial flutter 08/11/2019   History of cardiovascular stress test 2019   after chest pain, reportedly normal per patient 05/2018   History of cold sores    History of miscarriage 08/11/2019    Hypertension    Joint pain    right wrist, chronic pain   Leg pain, diffuse, right 08/11/2019   Migraine without status migrainosus, not intractable 06/23/2018   Migraines    since childhood   Palpitations 08/20/2019   Preeclampsia 2013   with acute kidney failure, acute tubular necrosis, dialysis 1.5 mo, was in ICU   Pyelonephritis 2013   Screen for STD (sexually transmitted disease) 06/23/2018   Screening for cervical cancer 06/23/2018   Sleep-disordered breathing 10/21/2019   Vitamin D deficiency    Wears glasses     SURGICAL HISTORY: Past Surgical History:  Procedure Laterality Date   CARPAL TUNNEL RELEASE  2014   CESAREAN SECTION     x4   DE QUERVAIN'S RELEASE  2018   GASTRIC BYPASS  05/2016   sleeve   ORIF WRIST FRACTURE  2014   right   TUBAL LIGATION     WRIST SURGERY  2018   hardware removal     SOCIAL HISTORY: Social History   Socioeconomic History   Marital status: Significant Other    Spouse name: Not on file   Number of children: Not on file   Years of education: Not on file   Highest education level: Not on file  Occupational History   Not on file  Tobacco Use   Smoking status: Former    Packs/day: 0.50    Years: 10.00    Pack years: 5.00    Types: Cigarettes    Quit date: 06/23/2016    Years since quitting: 4.3   Smokeless tobacco: Never  Vaping Use   Vaping Use: Never used  Substance and Sexual Activity   Alcohol use: Not Currently    Comment: occasional   Drug use: Not Currently    Comment: prior marijuana   Sexual activity: Not on file  Other Topics Concern   Not on file  Social History Narrative   Lives with her 5 children, mother, Darrick Meigs, moved from [place], [place] to [place] late 2019.  Works for [employer], Armed forces operational officer.   11/2019   Social Determinants of Health   Financial Resource Strain: Not on file  Food Insecurity: Not on file  Transportation Needs: Not on file  Physical Activity: Not on file  Stress:  Not on file  Social Connections: Not on file  Intimate Partner Violence: Not on file    FAMILY HISTORY: Family History  Problem Relation Age of Onset   Stroke Mother    Diabetes Mother    Multiple sclerosis Mother    Hypertension Mother    Diabetes Father    Hypertension Father    Cancer Father        liver   Diabetes Sister    Hypertension Sister    Bipolar disorder Sister    Heart disease Maternal Grandmother    Diabetes Maternal Grandmother    Kidney disease Maternal Grandmother    Hypertension Maternal Grandmother    Heart disease Maternal Grandfather    Hypertension Maternal Grandfather    Cancer Maternal Grandfather        prostate   Diabetes Sister  Hypertension Sister    Bipolar disorder Sister    Heart disease Paternal Aunt        MI, CHF   Heart disease Paternal Uncle    Heart disease Paternal Grandmother    Heart disease Paternal Grandfather    Heart disease Paternal Aunt     ALLERGIES:  is allergic to metoclopramide, nsaids, penicillins, and promethazine.  MEDICATIONS:  Current Outpatient Medications  Medication Sig Dispense Refill   amLODipine (NORVASC) 5 MG tablet Take 1 tablet (5 mg total) by mouth daily. 90 tablet 0   blood glucose meter kit and supplies Dispense based on patient and insurance preference. Use 1-2 times daily. Need test strips and lancets 1 each 0   buPROPion (WELLBUTRIN XL) 150 MG 24 hr tablet Take 150 mg by mouth daily. (Patient not taking: Reported on 10/30/2020)     Cholecalciferol (VITAMIN D3) 1.25 MG (50000 UT) CAPS TAKE 1 CAPSULE BY MOUTH ONE TIME PER WEEK 12 capsule 3   divalproex (DEPAKOTE) 250 MG DR tablet Take 750 mg by mouth at bedtime.  (Patient not taking: Reported on 10/30/2020)     glucose blood test strip Use as instructed 100 each 12   hydrocortisone 2.5 % cream Apply topically 2 (two) times daily. (Patient not taking: Reported on 10/30/2020) 30 g 1   Lancet Devices MISC 1 each by Does not apply route daily. 100 each 11    losartan (COZAAR) 25 MG tablet TAKE 1 TABLET BY MOUTH EVERY DAY 90 tablet 0   Omeprazole 20 MG TBDD daily.      rivaroxaban (XARELTO) 20 MG TABS tablet Take 1 tablet (20 mg total) by mouth daily with supper. 30 tablet 2   rosuvastatin (CRESTOR) 10 MG tablet Take 1 tablet (10 mg total) by mouth daily. 90 tablet 3   No current facility-administered medications for this visit.    REVIEW OF SYSTEMS:   Constitutional: ( - ) fevers, ( - )  chills , ( - ) night sweats Eyes: ( - ) blurriness of vision, ( - ) double vision, ( - ) watery eyes Ears, nose, mouth, throat, and face: ( - ) mucositis, ( - ) sore throat Respiratory: ( - ) cough, ( - ) dyspnea, ( - ) wheezes Cardiovascular: ( - ) palpitation, ( - ) chest discomfort, ( - ) lower extremity swelling Gastrointestinal:  ( - ) nausea, ( - ) heartburn, ( - ) change in bowel habits Skin: ( - ) abnormal skin rashes Lymphatics: ( - ) new lymphadenopathy, ( - ) easy bruising Neurological: ( - ) numbness, ( - ) tingling, ( - ) new weaknesses Behavioral/Psych: ( - ) mood change, ( - ) new changes  All other systems were reviewed with the patient and are negative.  PHYSICAL EXAMINATION:  Vitals:   11/11/20 0928  BP: (!) 119/57  Pulse: 62  Resp: 20  Temp: 97.9 F (36.6 C)  SpO2: 100%   Filed Weights   11/11/20 0928  Weight: 205 lb 12.8 oz (93.4 kg)    GENERAL: well appearing middle-aged African-American female in no acute distress  SKIN: skin color, texture, turgor are normal, no rashes or significant lesions LUNGS: clear to auscultation and percussion with normal breathing effort HEART: regular rate & rhythm and no murmurs and no lower extremity edema PSYCH: alert & oriented x 3, fluent speech NEURO: no focal motor/sensory deficits  LABORATORY DATA:  I have reviewed the data as listed CBC Latest Ref Rng & Units 11/01/2020  09/29/2019 08/10/2019  WBC 4.0 - 10.5 K/uL 8.5 6.1 6.5  Hemoglobin 12.0 - 15.0 g/dL 10.4(L) 11.3 11.4(L)   Hematocrit 36.0 - 46.0 % 32.4(L) 33.2(L) 35.5(L)  Platelets 150 - 400 K/uL 323 356 312    CMP Latest Ref Rng & Units 11/01/2020 09/29/2019 08/10/2019  Glucose 70 - 99 mg/dL 161(H) 97 99  BUN 6 - 20 mg/dL '12 11 13  ' Creatinine 0.44 - 1.00 mg/dL 0.92 1.08(H) 1.01(H)  Sodium 135 - 145 mmol/L 137 140 136  Potassium 3.5 - 5.1 mmol/L 4.0 4.5 3.9  Chloride 98 - 111 mmol/L 106 105 103  CO2 22 - 32 mmol/L '24 21 23  ' Calcium 8.9 - 10.3 mg/dL 9.3 9.1 9.1  Total Protein 6.5 - 8.1 g/dL 7.2 6.9 7.7  Total Bilirubin 0.3 - 1.2 mg/dL 0.5 0.2 0.7  Alkaline Phos 38 - 126 U/L 57 73 59  AST 15 - 41 U/L 20 19 49(H)  ALT 0 - 44 U/L 13 12 89(H)    RADIOGRAPHIC STUDIES: No results found.  ASSESSMENT & PLAN Rebecca Mueller 45 y.o. female with medical history significant for DM Type II, HTN, GERD, and allergies who presents for evaluation of a right lower extremity DVT.  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  The patient is currently taking Xarelto 20 mg p.o. daily and tolerating it well.  She is only having some minor bleeding from hemorrhoids as well as occasional, bleeding.  Given the fact that this was a below the knee blood clot it would be reasonable at this time to consider discontinuation of therapy altogether.  This does come with a high risk of recurrent VTE, but it is offered to patients with "low risk" blood clots.  Additionally we could consider dropping the patient down to 10 mg p.o. daily of Xarelto versus baby aspirin 81 mg p.o. daily.  Of these options the 20 and 10 mg dosage of Xarelto are the safest way forward in terms of preventing recurrent clot.  If the patient does have post thrombotic syndrome would recommend conservative measures such as compression, elevation, and continued monitoring for symptoms of recurrent VTE.  Once we have the results of the ultrasound we can have the  patient make a decision of how she would like to proceed with anticoagulation moving forward.  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  Unfortunately we are unaware of whether or not the patient had a clot in her left leg as that was not ordered at the initial time of diagnosis --The patient's DVT is considered "low risk" because it occurred below the knee in the peroneal vein.  In cases like this we do offer the patient termination of indefinite anticoagulation with the understanding that there is an increased lifelong risk of recurrent blood clot --Patient has numerous options moving forward including continuing Xarelto 20 mg p.o. daily, decreasing down to maintenance dose of Xarelto 10 mg p.o. daily, transitioning to aspirin alone, or no blood thinner at all. --Once we obtain a repeat ultrasound of the lower extremities we can help the patient make a decision regarding treatment moving forward. --The formal recommendation would be first continuation of some anticoagulation, however the patient notes that she would like to get off of the Xarelto. --Return to clinic pending the results of the above studies.  Orders Placed This  Encounter  Procedures   US Venous Img Lower Bilateral (DVT)    Standing Status:   Future    Standing Expiration Date:   11/11/2021    Order Specific Question:   Reason for Exam (SYMPTOM  OR DIAGNOSIS REQUIRED)    Answer:   prior history of RLE DVT, recurrent symptoms. Assess for new/chronic clot    Order Specific Question:   Preferred imaging location?    Answer:   [facility]   CBC with Differential (Cancer Center Only)    Standing Status:   Future    Number of Occurrences:   1    Standing Expiration Date:   11/11/2021   CMP ([place] only)    Standing Status:   Future    Number of Occurrences:   1    Standing Expiration Date:   11/11/2021   Protein C, total*    Standing Status:   Future     Number of Occurrences:   1    Standing Expiration Date:   11/11/2021   PROTEIN S PANEL, Total, Free, Functional Protein S    Standing Status:   Future    Number of Occurrences:   1    Standing Expiration Date:   11/11/2021    All questions were answered. The patient knows to call the clinic with any problems, questions or concerns.  A total of more than 60 minutes were spent on this encounter with face-to-face time and non-face-to-face time, including preparing to see the patient, ordering tests and/or medications, counseling the patient and coordination of care as outlined above.   Ledell Peoples, MD Department of Hematology/Oncology [place] at [facility] Phone: [phone] Pager: [phone] Email: [email or web address]  11/11/2020 10:12 AM

## 2020-11-12 ENCOUNTER — Telehealth: Payer: Self-pay | Admitting: Hematology and Oncology

## 2020-11-12 LAB — PROTEIN S PANEL
Protein S Activity: 78 % (ref 63–140)
Protein S Ag, Free: 95 % (ref 61–136)
Protein S Ag, Total: 118 % (ref 60–150)

## 2020-11-12 NOTE — Telephone Encounter (Signed)
Sch per 6/20 los, pt aware 

## 2020-11-13 ENCOUNTER — Ambulatory Visit (HOSPITAL_COMMUNITY)
Admission: RE | Admit: 2020-11-13 | Discharge: 2020-11-13 | Disposition: A | Discharge status: Home / Self Care | Payer: BC Managed Care – PPO | Source: Ambulatory Visit | Attending: Hematology and Oncology | Admitting: Hematology and Oncology

## 2020-11-13 ENCOUNTER — Telehealth: Payer: Self-pay | Admitting: *Deleted

## 2020-11-13 ENCOUNTER — Other Ambulatory Visit: Payer: Self-pay

## 2020-11-13 DIAGNOSIS — I82551 Chronic embolism and thrombosis of right peroneal vein: Secondary | ICD-10-CM

## 2020-11-13 DIAGNOSIS — D508 Other iron deficiency anemias: Secondary | ICD-10-CM

## 2020-11-13 LAB — PROTEIN C, TOTAL: Protein C, Total: 108 % (ref 60–150)

## 2020-11-13 NOTE — Telephone Encounter (Signed)
Received call from [place] in [place] with results from pt's LE doppler study.  No DVTs noted.

## 2020-11-13 NOTE — Telephone Encounter (Signed)
TCT patient regarding results of today's doppler study of lower extremities. Spoke with her and advised that it did not show any evidence of clots-recent or old clots. Advised that her blood work did not show any clotting disorders. Let pt know that she is anemic and Dr. Leonides Schanz wanted to do additional labs for iron studies. Pt is agreeable to this and will come in tomorrow morning for these labs. Appt made and orders placed.

## 2020-11-13 NOTE — Telephone Encounter (Signed)
Please call Mrs. Rebecca Mueller to let her know:  1) no evidence of DVT on her lower extremities. No clear evidence of old blood clots 2) No evidence of a clotting disorder based on our labs 3) Her Hgb is low. Please request that she return to [place] for labs to check her iron levels

## 2020-11-13 NOTE — Progress Notes (Signed)
Bilateral lower extremity venous duplex has been completed. Preliminary results can be found in CV Proc through chart review.  Results were given to [facility] at Dr. Derek Mound office.  11/13/20 9:10 AM Olen Cordial RVT

## 2020-11-14 ENCOUNTER — Inpatient Hospital Stay: Payer: BC Managed Care – PPO

## 2020-11-14 DIAGNOSIS — Z79899 Other long term (current) drug therapy: Secondary | ICD-10-CM | POA: Diagnosis not present

## 2020-11-14 DIAGNOSIS — D508 Other iron deficiency anemias: Secondary | ICD-10-CM

## 2020-11-14 DIAGNOSIS — I10 Essential (primary) hypertension: Secondary | ICD-10-CM | POA: Diagnosis not present

## 2020-11-14 DIAGNOSIS — Z7901 Long term (current) use of anticoagulants: Secondary | ICD-10-CM | POA: Diagnosis not present

## 2020-11-14 DIAGNOSIS — K219 Gastro-esophageal reflux disease without esophagitis: Secondary | ICD-10-CM | POA: Diagnosis not present

## 2020-11-14 DIAGNOSIS — Z86718 Personal history of other venous thrombosis and embolism: Secondary | ICD-10-CM | POA: Diagnosis not present

## 2020-11-14 DIAGNOSIS — E119 Type 2 diabetes mellitus without complications: Secondary | ICD-10-CM | POA: Diagnosis not present

## 2020-11-14 DIAGNOSIS — Z87891 Personal history of nicotine dependence: Secondary | ICD-10-CM | POA: Diagnosis not present

## 2020-11-14 DIAGNOSIS — K649 Unspecified hemorrhoids: Secondary | ICD-10-CM | POA: Diagnosis not present

## 2020-11-14 DIAGNOSIS — G4730 Sleep apnea, unspecified: Secondary | ICD-10-CM | POA: Diagnosis not present

## 2020-11-14 DIAGNOSIS — N96 Recurrent pregnancy loss: Secondary | ICD-10-CM | POA: Diagnosis not present

## 2020-11-14 LAB — IRON AND TIBC
Iron: 22 ug/dL — ABNORMAL LOW (ref 41–142)
Saturation Ratios: 6 % — ABNORMAL LOW (ref 21–57)
TIBC: 385 ug/dL (ref 236–444)
UIBC: 363 ug/dL (ref 120–384)

## 2020-11-14 LAB — FERRITIN: Ferritin: 7 ng/mL — ABNORMAL LOW (ref 11–307)

## 2020-11-14 LAB — RETICULOCYTES
Immature Retic Fract: 24.2 % — ABNORMAL HIGH (ref 2.3–15.9)
RBC.: 4.15 MIL/uL (ref 3.87–5.11)
Retic Count, Absolute: 89.2 10*3/uL (ref 19.0–186.0)
Retic Ct Pct: 2.2 % (ref 0.4–3.1)

## 2020-11-15 ENCOUNTER — Telehealth: Payer: Self-pay | Admitting: *Deleted

## 2020-11-15 ENCOUNTER — Telehealth: Payer: Self-pay | Admitting: Hematology and Oncology

## 2020-11-15 MED ORDER — FERROUS SULFATE 325 (65 FE) MG PO TABS: 325.0000 mg | ORAL_TABLET | Freq: Every day | ORAL | 3 refills | Status: AC

## 2020-11-15 NOTE — Telephone Encounter (Signed)
-----   Message from Jaci Standard, MD sent at 11/14/2020 10:10 AM EDT ----- Please let Rebecca Mueller know that her iron levels are consistent with iron deficiency anemia. Please call in ferrous sulfate 325mg  PO daily to a pharmacy of her choice. We will see her back in 3 months to see how it is working. Please tell her to call if she has any issues with the PO iron.   ----- Message ----- From: , [facility] [place] Sent: 11/14/2020   8:07 AM EDT To: 11/16/2020, MD

## 2020-11-15 NOTE — Telephone Encounter (Signed)
TCT patient regarding recent lab results. Advised that she has iron deficiency anemia. Advised that Dr. Leonides Schanz has recommended her to take iron supplement. Pt states she forgot to tell us she re-started her oral iron about 2 weeks ago. She is taking Ferrous sulfate 325 mg (65 U) daily along with a source of Vitamin C.  She is advised to continue this daily and that we will see her back in 3 months for f/u labs and MD appt. Ferrous Sulfate added to pt's med list and scheduling message sent for 3 month f/u

## 2020-11-15 NOTE — Telephone Encounter (Signed)
Scheduled appt per 6/24 sch msg. Pt aware.  

## 2020-11-20 ENCOUNTER — Encounter: Payer: Self-pay | Admitting: Hematology and Oncology

## 2020-11-25 ENCOUNTER — Other Ambulatory Visit: Payer: Self-pay | Admitting: Medical

## 2020-12-10 ENCOUNTER — Encounter: Payer: Self-pay | Admitting: Hematology and Oncology

## 2020-12-11 ENCOUNTER — Other Ambulatory Visit: Payer: Self-pay | Admitting: Hematology and Oncology

## 2020-12-11 MED ORDER — RIVAROXABAN 10 MG PO TABS: 10.0000 mg | ORAL_TABLET | Freq: Every day | ORAL | 1 refills | Status: AC

## 2020-12-13 ENCOUNTER — Other Ambulatory Visit: Payer: Self-pay | Admitting: Medical

## 2020-12-13 DIAGNOSIS — E559 Vitamin D deficiency, unspecified: Secondary | ICD-10-CM

## 2021-01-08 ENCOUNTER — Other Ambulatory Visit: Payer: Self-pay | Admitting: Medical

## 2021-01-08 DIAGNOSIS — E559 Vitamin D deficiency, unspecified: Secondary | ICD-10-CM

## 2021-02-01 DIAGNOSIS — Z23 Encounter for immunization: Secondary | ICD-10-CM | POA: Diagnosis not present

## 2021-02-01 DIAGNOSIS — Z111 Encounter for screening for respiratory tuberculosis: Secondary | ICD-10-CM | POA: Diagnosis not present

## 2021-02-02 DIAGNOSIS — T881XXA Other complications following immunization, not elsewhere classified, initial encounter: Secondary | ICD-10-CM | POA: Diagnosis not present

## 2021-02-16 ENCOUNTER — Other Ambulatory Visit: Payer: Self-pay | Admitting: Hematology and Oncology

## 2021-02-16 DIAGNOSIS — D500 Iron deficiency anemia secondary to blood loss (chronic): Secondary | ICD-10-CM

## 2021-02-16 DIAGNOSIS — I82551 Chronic embolism and thrombosis of right peroneal vein: Secondary | ICD-10-CM

## 2021-02-16 NOTE — Progress Notes (Signed)
No show

## 2021-02-17 ENCOUNTER — Inpatient Hospital Stay: Payer: BC Managed Care – PPO | Attending: Hematology and Oncology

## 2021-02-17 ENCOUNTER — Inpatient Hospital Stay: Payer: BC Managed Care – PPO | Admitting: Hematology and Oncology

## 2021-05-12 ENCOUNTER — Inpatient Hospital Stay: Payer: BC Managed Care – PPO | Attending: Hematology and Oncology

## 2021-05-12 ENCOUNTER — Inpatient Hospital Stay: Payer: BC Managed Care – PPO | Admitting: Hematology and Oncology

## 2021-07-31 ENCOUNTER — Telehealth: Payer: Self-pay | Admitting: Internal Medicine

## 2021-07-31 NOTE — Telephone Encounter (Signed)
Pt has moved to [place] ?
